# Patient Record
Sex: Female | Born: 1995 | Race: White | Hispanic: Yes | Marital: Single | Smoking: Former smoker
Health system: Southern US, Community
[De-identification: ages and names within clinical notes are randomized; demographics above are authoritative.]

## PROBLEM LIST (undated history)

## (undated) DIAGNOSIS — K219 Gastro-esophageal reflux disease without esophagitis: Secondary | ICD-10-CM

## (undated) DIAGNOSIS — R519 Headache, unspecified: Secondary | ICD-10-CM

## (undated) DIAGNOSIS — Z789 Other specified health status: Secondary | ICD-10-CM

## (undated) HISTORY — DX: Headache, unspecified: R51.9

## (undated) HISTORY — DX: Gastro-esophageal reflux disease without esophagitis: K21.9

## (undated) HISTORY — DX: Other specified health status: Z78.9

## (undated) HISTORY — PX: OTHER SURGICAL HISTORY: SHX169

---
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
## 2019-04-27 ENCOUNTER — Ambulatory Visit: Payer: Self-pay | Admitting: Family Medicine

## 2020-09-10 ENCOUNTER — Other Ambulatory Visit: Payer: Self-pay

## 2020-09-10 ENCOUNTER — Ambulatory Visit (LOCAL_COMMUNITY_HEALTH_CENTER): Payer: Self-pay

## 2020-09-10 VITALS — BP 101/58 | Ht 62.21 in | Wt 197.0 lb

## 2020-09-10 DIAGNOSIS — Z3201 Encounter for pregnancy test, result positive: Secondary | ICD-10-CM

## 2020-09-10 LAB — PREGNANCY, URINE: Preg Test, Ur: POSITIVE — AB

## 2020-09-10 MED ORDER — PRENATAL 27-0.8 MG PO TABS: 1.0000 | ORAL_TABLET | Freq: Every day | ORAL | 0 refills | Status: AC

## 2020-09-10 NOTE — Progress Notes (Signed)
UPT positive. Plans prenatal care at [facility]. No [facility] on file.  Lang line interpreter.  To clerk for preadmit. Jerel Shepherd, RN

## 2020-09-25 ENCOUNTER — Other Ambulatory Visit: Payer: Self-pay

## 2020-09-25 ENCOUNTER — Emergency Department: Payer: Self-pay

## 2020-09-25 ENCOUNTER — Emergency Department
Admission: EM | Admit: 2020-09-25 | Discharge: 2020-09-25 | Disposition: A | Discharge status: Home / Self Care | Payer: Self-pay | Attending: Emergency Medicine | Admitting: Emergency Medicine

## 2020-09-25 DIAGNOSIS — Z87891 Personal history of nicotine dependence: Secondary | ICD-10-CM | POA: Insufficient documentation

## 2020-09-25 DIAGNOSIS — Z3A01 Less than 8 weeks gestation of pregnancy: Secondary | ICD-10-CM | POA: Insufficient documentation

## 2020-09-25 DIAGNOSIS — R102 Pelvic and perineal pain: Secondary | ICD-10-CM

## 2020-09-25 DIAGNOSIS — O219 Vomiting of pregnancy, unspecified: Secondary | ICD-10-CM | POA: Insufficient documentation

## 2020-09-25 DIAGNOSIS — M545 Low back pain, unspecified: Secondary | ICD-10-CM | POA: Insufficient documentation

## 2020-09-25 DIAGNOSIS — R1032 Left lower quadrant pain: Secondary | ICD-10-CM | POA: Insufficient documentation

## 2020-09-25 DIAGNOSIS — O26891 Other specified pregnancy related conditions, first trimester: Secondary | ICD-10-CM | POA: Insufficient documentation

## 2020-09-25 LAB — URINALYSIS, COMPLETE (UACMP) WITH MICROSCOPIC
Bacteria, UA: NONE SEEN
Bilirubin Urine: NEGATIVE
Glucose, UA: NEGATIVE mg/dL
Hgb urine dipstick: NEGATIVE
Ketones, ur: 80 mg/dL — AB
Leukocytes,Ua: NEGATIVE
Nitrite: NEGATIVE
Protein, ur: 30 mg/dL — AB
Specific Gravity, Urine: 1.027 (ref 1.005–1.030)
pH: 6 (ref 5.0–8.0)

## 2020-09-25 LAB — CBC
HCT: 39.5 % (ref 36.0–46.0)
Hemoglobin: 13.3 g/dL (ref 12.0–15.0)
MCH: 28.4 pg (ref 26.0–34.0)
MCHC: 33.7 g/dL (ref 30.0–36.0)
MCV: 84.4 fL (ref 80.0–100.0)
Platelets: 356 10*3/uL (ref 150–400)
RBC: 4.68 MIL/uL (ref 3.87–5.11)
RDW: 13.4 % (ref 11.5–15.5)
WBC: 12.2 10*3/uL — ABNORMAL HIGH (ref 4.0–10.5)
nRBC: 0 % (ref 0.0–0.2)

## 2020-09-25 LAB — LIPASE, BLOOD: Lipase: 29 U/L (ref 11–51)

## 2020-09-25 LAB — COMPREHENSIVE METABOLIC PANEL
ALT: 33 U/L (ref 0–44)
AST: 21 U/L (ref 15–41)
Albumin: 4 g/dL (ref 3.5–5.0)
Alkaline Phosphatase: 65 U/L (ref 38–126)
Anion gap: 8 (ref 5–15)
BUN: 6 mg/dL (ref 6–20)
CO2: 24 mmol/L (ref 22–32)
Calcium: 9.1 mg/dL (ref 8.9–10.3)
Chloride: 101 mmol/L (ref 98–111)
Creatinine, Ser: 0.52 mg/dL (ref 0.44–1.00)
GFR, Estimated: >60 mL/min (ref >60)
Glucose, Bld: 96 mg/dL (ref 70–99)
Potassium: 3.4 mmol/L — ABNORMAL LOW (ref 3.5–5.1)
Sodium: 133 mmol/L — ABNORMAL LOW (ref 135–145)
Total Bilirubin: 0.7 mg/dL (ref 0.3–1.2)
Total Protein: 8.2 g/dL — ABNORMAL HIGH (ref 6.5–8.1)

## 2020-09-25 LAB — HCG, QUANTITATIVE, PREGNANCY: hCG, Beta Chain, Quant, S: 87762 m[IU]/mL — ABNORMAL HIGH (ref <5)

## 2020-09-25 MED ORDER — ONDANSETRON 4 MG PO TBDP: 4.0000 mg | ORAL_TABLET | Freq: Three times a day (TID) | ORAL | 0 refills | Status: DC | PRN

## 2020-09-25 MED ORDER — SODIUM CHLORIDE 0.9 % IV BOLUS
1000.0000 mL | Freq: Once | INTRAVENOUS | Status: AC
  Administered 2020-09-25: 1000 mL via INTRAVENOUS

## 2020-09-25 MED ORDER — ACETAMINOPHEN 500 MG PO TABS
1000.0000 mg | ORAL_TABLET | Freq: Once | ORAL | Status: AC
  Administered 2020-09-25: 1000 mg via ORAL
  Filled 2020-09-25: qty 2

## 2020-09-25 MED ORDER — ONDANSETRON HCL 4 MG/2ML IJ SOLN
4.0000 mg | Freq: Once | INTRAMUSCULAR | Status: AC
  Administered 2020-09-25: 4 mg via INTRAVENOUS
  Filled 2020-09-25: qty 2

## 2020-09-25 MED ORDER — POTASSIUM CHLORIDE ER 10 MEQ PO TBCR: 10.0000 meq | EXTENDED_RELEASE_TABLET | Freq: Every day | ORAL | 0 refills | Status: DC

## 2020-09-25 NOTE — ED Triage Notes (Signed)
Pt states she has had a headache, nausea and vomiting for a week. Pt states she is 2 months pregnant.  Pt states she has not seen an OB yet but her first appointment is 10/04/20  AMN interpreter: Marquita Palms [id number]

## 2020-09-25 NOTE — ED Provider Notes (Signed)
[facility] Emergency Department Provider Note  ____________________________________________   Event Date/Time   First MD Initiated Contact with Patient 09/25/20 2019     (approximate)  I have reviewed the triage vital signs and the nursing notes.   HISTORY  Chief Complaint Emesis During Pregnancy    HPI Katie Olson is a 25 y.o. female Who is currently pregnant who comes in with nausea vomiting.  Patient states that she is about [redacted] weeks pregnant.  She is not had a ultrasound yet this pregnancy.  She states that for the past week she is had nausea and vomiting multiple times a day, nonbloody.  She states that she is had some associated lower back pain and left lower quadrant pain.  She is not taken anything for it, nothing makes it worse except for the vomiting.  Denies having this previously.  This is her third pregnancy.  No prior issues with her pregnancies.  Denies vaginal bleeding.  Denies vaginal discharge or concerns for STDs  This patient partner was used for any history and updates       Past Medical History:  Diagnosis Date  . Patient denies medical problems     Patient Active Problem List   Diagnosis Date Noted  . Breakthrough bleeding on Nexplanon 09/07/2015  . Language barrier 10/10/2014    Past Surgical History:  Procedure Laterality Date  . denies      Prior to Admission medications   Medication Sig Start Date End Date Taking? Authorizing Provider  etonogestrel (NEXPLANON) 68 MG IMPL implant 1 each by Subdermal route once. Patient not taking: Reported on 09/10/2020    [provider]  medroxyPROGESTERone (PROVERA) 10 MG tablet Take 2 tablets (20 mg total) by mouth daily. Patient not taking: Reported on 09/10/2020 09/07/15   Adam Phenix, MD  Prenatal Vit-Fe Fumarate-FA (MULTIVITAMIN-PRENATAL) 27-0.8 MG TABS tablet Take 1 tablet by mouth daily at 12 noon. 09/10/20 12/19/20  Federico Flake, MD     Allergies Patient has no known allergies.  No family history on file.  Social History Social History   Tobacco Use  . Smoking status: Former Smoker    Types: Cigarettes    Quit date: 08/10/2020    Years since quitting: 0.1  . Smokeless tobacco: Never Used  Vaping Use  . Vaping Use: Never used  Substance Use Topics  . Alcohol use: No  . Drug use: No      Review of Systems Constitutional: No fever/chills Eyes: No visual changes. ENT: No sore throat. Cardiovascular: Denies chest pain. Respiratory: Denies shortness of breath. Gastrointestinal: Positive abdominal pain, nausea, vomiting Genitourinary: Negative for dysuria. Musculoskeletal: Negative for back pain. Skin: Negative for rash. Neurological: Negative for headaches, focal weakness or numbness. All other ROS negative ____________________________________________   PHYSICAL EXAM:  VITAL SIGNS: ED Triage Vitals  Enc Vitals Group     BP 09/25/20 1853 131/76     Pulse Rate 09/25/20 1854 87     Resp 09/25/20 1853 17     Temp 09/25/20 1853 98.1 F (36.7 C)     Temp src --      SpO2 09/25/20 1853 98 %     Weight 09/25/20 1855 197 lb (89.4 kg)     Height 09/25/20 1855 5' 10.08" (1.78 m)     Head Circumference --      Peak Flow --      Pain Score 09/25/20 1855 8     Pain Loc --  Pain Edu? --      Excl. in GC? --     Constitutional: Alert and oriented. Well appearing and in no acute distress. Eyes: Conjunctivae are normal. EOMI. Head: Atraumatic. Nose: No congestion/rhinnorhea. Mouth/Throat: Mucous membranes are moist.   Neck: No stridor. Trachea Midline. FROM Cardiovascular: Normal rate, regular rhythm. Grossly normal heart sounds.  Good peripheral circulation. Respiratory: Normal respiratory effort.  No retractions. Lungs CTAB. Gastrointestinal: Soft but slight left lower quadrant tenderness no distention. No abdominal bruits.  Musculoskeletal: No lower extremity tenderness nor edema.  No joint  effusions. Neurologic:  Normal speech and language. No gross focal neurologic deficits are appreciated.  Skin:  Skin is warm, dry and intact. No rash noted. Psychiatric: Mood and affect are normal. Speech and behavior are normal. GU: Deferred   ____________________________________________   LABS (all labs ordered are listed, but only abnormal results are displayed)  Labs Reviewed  COMPREHENSIVE METABOLIC PANEL - Abnormal; Notable for the following components:      Result Value   Sodium 133 (*)    Potassium 3.4 (*)    Total Protein 8.2 (*)    All other components within normal limits  CBC - Abnormal; Notable for the following components:   WBC 12.2 (*)    All other components within normal limits  URINALYSIS, COMPLETE (UACMP) WITH MICROSCOPIC - Abnormal; Notable for the following components:   Color, Urine AMBER (*)    APPearance HAZY (*)    Ketones, ur 80 (*)    Protein, ur 30 (*)    All other components within normal limits  HCG, QUANTITATIVE, PREGNANCY - Abnormal; Notable for the following components:   hCG, Beta Chain, Quant, S 09,983 (*)    All other components within normal limits  URINE CULTURE  LIPASE, BLOOD   ____________________________________________  RADIOLOGY   Official radiology report(s): US OB LESS THAN 14 WEEKS WITH OB TRANSVAGINAL  Result Date: 09/25/2020 CLINICAL DATA:  Left lower quadrant pain, pregnant EXAM: OBSTETRIC <14 WK [place] AND TRANSVAGINAL OB US TECHNIQUE: Both transabdominal and transvaginal ultrasound examinations were performed for complete evaluation of the gestation as well as the maternal uterus, adnexal regions, and pelvic cul-de-sac. Transvaginal technique was performed to assess early pregnancy. COMPARISON:  None. FINDINGS: Intrauterine gestational sac: Single Yolk sac:  Not Visualized. Embryo:  Visualized. Cardiac Activity: Visualized. Heart Rate: 158 bpm CRL:  13.8 mm   7 w   5 d                  [place] EDC: 05/09/2021 Subchorionic hemorrhage:   Small Maternal uterus/adnexae: Normal appearing ovaries. No free fluid seen within the cul-de-sac. IMPRESSION: Single live intrauterine pregnancy measuring 7 weeks 5 days. Electronically Signed   By: Jonna Clark M.D.   On: 09/25/2020 21:54    ____________________________________________   PROCEDURES  Procedure(s) performed (including Critical Care):  Procedures   ____________________________________________   INITIAL IMPRESSION / ASSESSMENT AND PLAN / ED COURSE  Katie Olson was evaluated in Emergency Department on 09/25/2020 for the symptoms described in the history of present illness. She was evaluated in the context of the global COVID-19 pandemic, which necessitated consideration that the patient might be at risk for infection with the SARS-CoV-2 virus that causes COVID-19. Institutional protocols and algorithms that pertain to the evaluation of patients at risk for COVID-19 are in a state of rapid change based on information released by regulatory bodies including the CDC and federal and state organizations. These policies and algorithms were followed  during the patient's care in the ED.    Patient is a 25 year old who is currently pregnant but no prior ultrasound who comes in with nausea vomiting, left lower quadrant pain.  Will get labs to evaluate for Electra abnormalities, AKI, urine evaluate for UTI, ultrasound evaluate for ectopic versus ovarian issue.  No right lower quadrant pain to suggest appendicitis.  No right upper quadrant pain to suggest cholecystitis.  We will give a dose of Zofran after discussing the risk of it as well as some IV fluids.  Declines concerns for STDs and declines pelvic  Labs are reassuring.  Potassium slightly low.  We will give some oral supplementation for home.  White count slightly elevated but no signs of infection  Ultrasound confirms intrauterine pregnancy.  UA has 6-10 whites but no bacteria therefore do not think we need to treat  asymptomatic bacteria.  No dysuria Will send for culture just to make sure.  Reevaluated patient after the fluids and Zofran.  She is feeling better.  Patient is tolerating liquids.  Patient has a follow-up appointment with her OB/GYN in 9 days.  Will discharge patient home with some Zofran and we discussed return precautions  Spanish interpreter was used for this encounter       ____________________________________________   FINAL CLINICAL IMPRESSION(S) / ED DIAGNOSES   Final diagnoses:  Nausea and vomiting in pregnancy      MEDICATIONS GIVEN DURING THIS VISIT:  Medications  sodium chloride 0.9 % bolus 1,000 mL (1,000 mLs Intravenous Bolus from Bag 09/25/20 2053)  acetaminophen (TYLENOL) tablet 1,000 mg (1,000 mg Oral Given 09/25/20 2049)  ondansetron (ZOFRAN) injection 4 mg (4 mg Intravenous Given 09/25/20 2053)     ED Discharge Orders         Ordered    potassium chloride (KLOR-CON) 10 MEQ tablet  Daily        09/25/20 2301    ondansetron (ZOFRAN ODT) 4 MG disintegrating tablet  Every 8 hours PRN        09/25/20 2301           Note:  This document was prepared using Dragon voice recognition software and may include unintentional dictation errors.   Concha Se, MD 09/25/20 [phone]

## 2020-09-25 NOTE — Discharge Instructions (Addendum)
Your potassium was slightly low and you can take the potassium pills at home when you are able to tolerate drinking.  The Zofran will help you with your nausea.  Return to the ER if develop worsening vomiting and nausea he cannot keep anything down.  Otherwise follow-up with your OB/GYN

## 2020-09-25 NOTE — ED Notes (Signed)
Patient transported to Ultrasound 

## 2020-09-25 NOTE — ED Notes (Signed)
Spanish interpreter Katie Olson [phone] utilized.

## 2020-09-27 LAB — URINE CULTURE: Culture: 70000 — AB

## 2020-10-03 ENCOUNTER — Telehealth: Payer: Self-pay

## 2020-10-03 NOTE — Telephone Encounter (Signed)
Based on 09/25/20 U/S in Epic, pt was only 7.5 weeks on that day. Calling pt to reschedule initial OB appt.  Phone call to pt with language line interpreter. Left message on voicemail that based on 09/25/20 U/S, it appears her initial OB appt scheduled for 10/04/20 is a little early. Please call [facility] to reschedule intial OB appt, would like to reschedule about 2 weeks from now. Please call [phone].

## 2020-10-04 ENCOUNTER — Other Ambulatory Visit: Payer: Self-pay

## 2020-10-04 ENCOUNTER — Ambulatory Visit: Payer: Medicaid Other | Admitting: Physician Assistant

## 2020-10-04 VITALS — BP 97/59 | HR 76 | Temp 98.5°F | Ht 62.0 in | Wt 191.4 lb

## 2020-10-04 DIAGNOSIS — E669 Obesity, unspecified: Secondary | ICD-10-CM | POA: Insufficient documentation

## 2020-10-04 DIAGNOSIS — Z3401 Encounter for supervision of normal first pregnancy, first trimester: Secondary | ICD-10-CM

## 2020-10-04 DIAGNOSIS — Z23 Encounter for immunization: Secondary | ICD-10-CM

## 2020-10-04 DIAGNOSIS — O0990 Supervision of high risk pregnancy, unspecified, unspecified trimester: Secondary | ICD-10-CM | POA: Insufficient documentation

## 2020-10-04 DIAGNOSIS — O99210 Obesity complicating pregnancy, unspecified trimester: Secondary | ICD-10-CM | POA: Insufficient documentation

## 2020-10-04 DIAGNOSIS — E66811 Obesity, class 1: Secondary | ICD-10-CM | POA: Insufficient documentation

## 2020-10-04 LAB — URINALYSIS
Bilirubin, UA: NEGATIVE
Glucose, UA: NEGATIVE
Ketones, UA: NEGATIVE
Leukocytes,UA: NEGATIVE
Nitrite, UA: NEGATIVE
Protein,UA: NEGATIVE
RBC, UA: NEGATIVE
Specific Gravity, UA: 1.015 (ref 1.005–1.030)
Urobilinogen, Ur: 0.2 mg/dL (ref 0.2–1.0)
pH, UA: 7 (ref 5.0–7.5)

## 2020-10-04 LAB — HEMOGLOBIN, FINGERSTICK: Hemoglobin: 12.8 g/dL (ref 11.1–15.9)

## 2020-10-04 NOTE — Progress Notes (Signed)
Presents for MH IP. Takes PNV daily. Ed visit on 09/25/20 for nausea/vomiting, see encounter. Urine dip, Hgb reviewed with provider, no tx needed. Flu vaccine given today, tolerated well. COVID booster given today, tolerated well. Sharlyne Pacas, RN

## 2020-10-04 NOTE — Progress Notes (Signed)
[facility] referral for NIPT/cfDNA, snapshot pages and prior [facility] [place] from 09/25/2020 faxed with confirmation received. [facility] scheduler will notify client of appt via phone call. Jossie Ng, RN

## 2020-10-04 NOTE — Telephone Encounter (Signed)
Pt kept 10/04/20 OB appt.

## 2020-10-04 NOTE — Progress Notes (Signed)
[facility] HEALTH DEPT [facility] [address] [place] [address] [phone]  INITIAL PRENATAL VISIT NOTE  Subjective:  Katie Olson is a 25 y.o. G3P2002 at [redacted]w[redacted]d being seen today to start prenatal care at the [facility] Department.  She is currently monitored for the following issues for this high-risk pregnancy and has Language barrier; Breakthrough bleeding on Nexplanon; Obesity (BMI 30.0-34.9); and Obesity in pregnancy on their problem list.  Patient reports no complaints.  Contractions: Not present. Vag. Bleeding: None.  Movement: Absent. Denies leaking of fluid.   Indications for ASA therapy (per uptodate) One of the following: Previous pregnancy with preeclampsia, especially early onset and with an adverse outcome No Multifetal gestation No Chronic hypertension No Type 1 or 2 diabetes mellitus No Chronic kidney disease No Autoimmune disease (antiphospholipid syndrome, systemic lupus erythematosus) No  Two or more of the following: Nulliparity No Obesity (body mass index >30 kg/m2) Yes Family history of preeclampsia in mother or sister No Age ?35 years No Sociodemographic characteristics (African American race, low socioeconomic level) Yes Personal risk factors (eg, previous pregnancy with low birth weight or small for gestational age infant, previous adverse pregnancy outcome [eg, stillbirth], interval >10 years between pregnancies) No   The following portions of the patient's history were reviewed and updated as appropriate: allergies, current medications, past family history, past medical history, past social history, past surgical history and problem list. Problem list updated.  Objective:   Vitals:   10/04/20 0902 10/04/20 0904  BP: (!) 97/59   Pulse: 76   Temp: 98.5 F (36.9 C)   Weight: 191 lb 6.4 oz (86.8 kg)   Height:  5\' 2"  (1.575 m)    Fetal Status: Fetal Heart Rate (bpm): not heard  Fundal Height: 12 cm Movement: Absent  Presentation: Undeterminable   Physical Exam Vitals and nursing note reviewed.  Constitutional:      General: She is not in acute distress.    Appearance: Normal appearance. She is well-developed. She is obese.  HENT:     Head: Normocephalic and atraumatic.     Right Ear: External ear normal.     Left Ear: External ear normal.     Nose: Nose normal. No congestion or rhinorrhea.     Mouth/Throat:     Lips: Pink.     Mouth: Mucous membranes are moist.     Dentition: Normal dentition (normal). No dental caries.     Pharynx: Oropharynx is clear. Uvula midline.     Comments: Dentition: teeth in good repair Eyes:     General: No scleral icterus.    Conjunctiva/sclera: Conjunctivae normal.  Neck:     Thyroid: No thyroid mass or thyromegaly.  Cardiovascular:     Rate and Rhythm: Normal rate.     Pulses: Normal pulses.     Comments: Extremities are warm and well perfused Pulmonary:     Effort: Pulmonary effort is normal.     Breath sounds: Normal breath sounds.  Chest:     Chest wall: No mass.  Breasts:     Tanner Score is 5. Breasts are symmetrical.     Right: Normal. No mass, nipple discharge, skin change or axillary adenopathy.     Left: Normal. No mass, nipple discharge, skin change or axillary adenopathy.    Abdominal:     General: Abdomen is flat.     Palpations: Abdomen is soft.     Tenderness: There is no abdominal tenderness.  Comments: Gravid   Genitourinary:    General: Normal vulva.     Exam position: Lithotomy position.     Pubic Area: No rash.      Labia:        Right: No rash.        Left: No rash.      Vagina: Normal. No vaginal discharge.     Cervix: No cervical motion tenderness or friability.     Uterus: Normal. Enlarged (Gravid 10 week size). Not tender.      Adnexa: Right adnexa normal and left adnexa normal.     Rectum: Normal. No external hemorrhoid.  Musculoskeletal:     Right lower leg: No edema.      Left lower leg: No edema.  Lymphadenopathy:     Cervical: No cervical adenopathy.     Upper Body:     Right upper body: No axillary adenopathy.     Left upper body: No axillary adenopathy.  Skin:    General: Skin is warm.     Capillary Refill: Capillary refill takes less than 2 seconds.     Comments: Tattoos on upper chest  Neurological:     Mental Status: She is alert.     Assessment and Plan:  Pregnancy: G3P2002 at [redacted]w[redacted]d  1. Encounter for supervision of normal first pregnancy in first trimester Desires flu shot and COVID booster today. Sched first trim aneuploidy screening. - Urine Culture - HIV-1/HIV-2 Qualitative RNA - Chlamydia/GC NAA, Confirmation - Glucose, 1 hour gestational - HCV Ab w Reflex to Quant PCR - Hemoglobin, fingerstick - Protein / creatinine ratio, urine - QuantiFERON-TB Gold Plus - Comprehensive metabolic panel - Urinalysis (Urine Dip) - Prenatal profile without Varicella or Rubella - Varicella zoster antibody, IgG - Pap IG (Image Guided)  2. Obesity in pregnancy Baseline labs today. Counseled re: appropriate wt gain. Refer for MNT. Rec low dose aspirin starting at 12 wks - pt agrees with this plan.    Discussed overview of care and coordination with inpatient delivery practices including [facility], [facility], [facility] and [facility].    Preterm labor symptoms and general obstetric precautions including but not limited to vaginal bleeding, contractions, leaking of fluid and fetal movement were reviewed in detail with the patient.  Please refer to After Visit Summary for other counseling recommendations.   Return in about 4 weeks (around 11/01/2020) for Routine prenatal care.  No future appointments.  Landry Dyke, PA-C

## 2020-10-05 LAB — HIV-1/HIV-2 QUALITATIVE RNA
HIV-1 RNA, Qualitative: NONREACTIVE
HIV-2 RNA, Qualitative: NONREACTIVE

## 2020-10-06 LAB — URINE CULTURE

## 2020-10-06 LAB — PROTEIN / CREATININE RATIO, URINE
Creatinine, Urine: 56 mg/dL
Protein, Ur: 6.4 mg/dL
Protein/Creat Ratio: 114 mg/g creat (ref 0–200)

## 2020-10-06 LAB — CHLAMYDIA/GC NAA, CONFIRMATION
Chlamydia trachomatis, NAA: NEGATIVE
Neisseria gonorrhoeae, NAA: NEGATIVE

## 2020-10-07 LAB — COMPREHENSIVE METABOLIC PANEL
ALT: 15 IU/L (ref 0–32)
AST: 17 IU/L (ref 0–40)
Albumin/Globulin Ratio: 1.3 (ref 1.2–2.2)
Albumin: 4 g/dL (ref 3.9–5.0)
Alkaline Phosphatase: 66 IU/L (ref 44–121)
BUN/Creatinine Ratio: 8 — ABNORMAL LOW (ref 9–23)
BUN: 4 mg/dL — ABNORMAL LOW (ref 6–20)
Bilirubin Total: 0.2 mg/dL (ref 0.0–1.2)
CO2: 20 mmol/L (ref 20–29)
Calcium: 9.2 mg/dL (ref 8.7–10.2)
Chloride: 101 mmol/L (ref 96–106)
Creatinine, Ser: 0.5 mg/dL — ABNORMAL LOW (ref 0.57–1.00)
Globulin, Total: 3.1 g/dL (ref 1.5–4.5)
Glucose: 114 mg/dL — ABNORMAL HIGH (ref 65–99)
Potassium: 3.4 mmol/L — ABNORMAL LOW (ref 3.5–5.2)
Sodium: 139 mmol/L (ref 134–144)
Total Protein: 7.1 g/dL (ref 6.0–8.5)
eGFR: 135 mL/min/{1.73_m2} (ref >59)

## 2020-10-07 LAB — CBC/D/PLT+RPR+RH+ABO+AB SCR
Antibody Screen: NEGATIVE
Basophils Absolute: 0 10*3/uL (ref 0.0–0.2)
Basos: 0 %
EOS (ABSOLUTE): 0 10*3/uL (ref 0.0–0.4)
Eos: 0 %
Hematocrit: 38.3 % (ref 34.0–46.6)
Hemoglobin: 13.1 g/dL (ref 11.1–15.9)
Hepatitis B Surface Ag: NEGATIVE
Immature Grans (Abs): 0.1 10*3/uL (ref 0.0–0.1)
Immature Granulocytes: 1 %
Lymphocytes Absolute: 1.6 10*3/uL (ref 0.7–3.1)
Lymphs: 18 %
MCH: 29 pg (ref 26.6–33.0)
MCHC: 34.2 g/dL (ref 31.5–35.7)
MCV: 85 fL (ref 79–97)
Monocytes Absolute: 0.3 10*3/uL (ref 0.1–0.9)
Monocytes: 4 %
Neutrophils Absolute: 6.7 10*3/uL (ref 1.4–7.0)
Neutrophils: 77 %
Platelets: 320 10*3/uL (ref 150–450)
RBC: 4.52 x10E6/uL (ref 3.77–5.28)
RDW: 13.1 % (ref 11.7–15.4)
RPR Ser Ql: NONREACTIVE
Rh Factor: POSITIVE
WBC: 8.7 10*3/uL (ref 3.4–10.8)

## 2020-10-07 LAB — QUANTIFERON-TB GOLD PLUS
QuantiFERON Mitogen Value: >10 IU/mL
QuantiFERON Nil Value: 0.05 IU/mL
QuantiFERON TB1 Ag Value: 0.07 IU/mL
QuantiFERON TB2 Ag Value: 0.06 IU/mL
QuantiFERON-TB Gold Plus: NEGATIVE

## 2020-10-07 LAB — HCV AB W REFLEX TO QUANT PCR: HCV Ab: <0.1 s/co ratio (ref 0.0–0.9)

## 2020-10-07 LAB — HCV INTERPRETATION

## 2020-10-07 LAB — VARICELLA ZOSTER ANTIBODY, IGG: Varicella zoster IgG: 376 index (ref >165)

## 2020-10-07 LAB — GLUCOSE, 1 HOUR GESTATIONAL: Gestational Diabetes Screen: 101 mg/dL (ref 65–139)

## 2020-10-09 LAB — PAP IG (IMAGE GUIDED): PAP Smear Comment: 0

## 2020-10-10 ENCOUNTER — Telehealth: Payer: Self-pay

## 2020-10-10 NOTE — Telephone Encounter (Signed)
[facility] has attempted 3 phone calls to client without success to schedule genetic counseling with [place] appt (referral previously faxed with confirmation). Call to client with [facility] [employer]pre[employer] ID # [phone] and left message requesting client call maternity clinic to discuss [place] appt and need to schedule with [facility]. [facility] number to call provided. Jossie Ng, RN

## 2020-10-11 ENCOUNTER — Telehealth: Payer: Self-pay | Admitting: Dietician

## 2020-10-11 NOTE — Telephone Encounter (Signed)
Phone call to [phone] with interpreter Salli Real.  Pt confirms that [phone] is best number to reach her at. Pt counseled about [facility] has been calling her to try to schedule her 1st trimester genetic counseling and U/S, and this has to be done in a certain time frame. Pt states she will call [facility] right away, number given to Genetic Counseling [phone]), and let them know she is returning their phone call and let them know she needs 1st trimester genetic counseling and U/S.

## 2020-10-11 NOTE — Telephone Encounter (Signed)
Phone call to pt with Marlene [place] interpreter (was going to give alternative [facility] number for U/S scheduling). Pt states she already made U/S appt. Asked pt if she told them she needed Genetic Counseling also, "No" she had not.  Pt states she will call same number and ask for both U/S and Genetic Counseling appts back to back on same day.

## 2020-10-11 NOTE — Telephone Encounter (Signed)
Per [employer], client has 10/29/2020 genetic counseling appt via phone call at 2 pm and 11/05/2020 [place] appt at 1 pm [facility]). Jossie Ng, RN'

## 2020-11-02 ENCOUNTER — Other Ambulatory Visit: Payer: Self-pay

## 2020-11-02 ENCOUNTER — Ambulatory Visit: Payer: Medicaid Other | Admitting: Advanced Practice Midwife

## 2020-11-02 VITALS — BP 108/68 | HR 83 | Temp 97.7°F | Wt 182.0 lb

## 2020-11-02 DIAGNOSIS — O99210 Obesity complicating pregnancy, unspecified trimester: Secondary | ICD-10-CM

## 2020-11-02 DIAGNOSIS — O0990 Supervision of high risk pregnancy, unspecified, unspecified trimester: Secondary | ICD-10-CM

## 2020-11-02 LAB — URINALYSIS
Bilirubin, UA: NEGATIVE
Glucose, UA: NEGATIVE
Leukocytes,UA: NEGATIVE
Nitrite, UA: NEGATIVE
Protein,UA: NEGATIVE
RBC, UA: NEGATIVE
Specific Gravity, UA: 1.015 (ref 1.005–1.030)
Urobilinogen, Ur: 0.2 mg/dL (ref 0.2–1.0)
pH, UA: 7 (ref 5.0–7.5)

## 2020-11-02 MED ORDER — PROMETHAZINE HCL 25 MG PO TABS: 25.0000 mg | ORAL_TABLET | Freq: Four times a day (QID) | ORAL | 0 refills | Status: DC | PRN

## 2020-11-02 NOTE — Progress Notes (Signed)
Urinalysis results reviewed by provider. Provider reviewed results with pt and handwritten Rx for Phenergan provided to pt for N/V.

## 2020-11-02 NOTE — Progress Notes (Signed)
Pt denies visits to ER since last appt with [facility].  Taking PNV.  Pt requesting additional zofran rx, is vomitting about 8x per day. Not currently taking ASA.

## 2020-11-02 NOTE — Progress Notes (Signed)
   PRENATAL VISIT NOTE  Subjective:  Katie Olson is a 25 y.o. G3P2002 at [redacted]w[redacted]d being seen today for ongoing prenatal care.  She is currently monitored for the following issues for this low-risk pregnancy and has Language barrier; Obesity in pregnancy  BMI=33.2; and Supervision of high-risk pregnancy, unspecified trimester on their problem list.  Patient reports N&V daily.  Contractions: Not present. Vag. Bleeding: None.  Movement: Absent. Denies leaking of fluid/ROM.   The following portions of the patient's history were reviewed and updated as appropriate: allergies, current medications, past family history, past medical history, past social history, past surgical history and problem list. Problem list updated.  Objective:   Vitals:   11/02/20 0929  BP: 108/68  Pulse: 83  Temp: 97.7 F (36.5 C)  Weight: 182 lb (82.6 kg)    Fetal Status: Fetal Heart Rate (bpm): 160 Fundal Height: 13 cm Movement: Absent     General:  Alert, oriented and cooperative. Patient is in no acute distress.  Skin: Skin is warm and dry. No rash noted.   Cardiovascular: Normal heart rate noted  Respiratory: Normal respiratory effort, no problems with respiration noted  Abdomen: Soft, gravid, appropriate for gestational age.  Pain/Pressure: Absent     Pelvic: Cervical exam deferred        Extremities: Normal range of motion.  Edema: None  Mental Status: Normal mood and affect. Normal behavior. Normal judgment and thought content.   Assessment and Plan:  Pregnancy: G3P2002 at [redacted]w[redacted]d  1. Obesity in pregnancy Pt not taking ASA 81 mg--counseled and info given. Pt agrees to buy and begin today 2 lb (0.907 kg)  - Urinalysis (Urine Dip)  2. Supervision of high-risk pregnancy, unspecified trimester C/o daily N&V 2+x/day.  No breakfast yet today. 9 lb wt loss in 4 wks. Suggestions given for high protein snacks q 2 hours. Phenergan 25 mg po prn N&V. U/a today Low K at last apt so repeat today. Living with  FOB, 2 daughters, mother in law Not working Reminded of GC, FIRST screen and u/s on 11/05/20  - Comprehensive metabolic panel   Preterm labor symptoms and general obstetric precautions including but not limited to vaginal bleeding, contractions, leaking of fluid and fetal movement were reviewed in detail with the patient. Please refer to After Visit Summary for other counseling recommendations.  No follow-ups on file.  No future appointments.  Alberteen Spindle, CNM

## 2020-11-03 LAB — COMPREHENSIVE METABOLIC PANEL
ALT: 7 IU/L (ref 0–32)
AST: 10 IU/L (ref 0–40)
Albumin/Globulin Ratio: 1.7 (ref 1.2–2.2)
Albumin: 4.3 g/dL (ref 3.9–5.0)
Alkaline Phosphatase: 56 IU/L (ref 44–121)
BUN/Creatinine Ratio: 9 (ref 9–23)
BUN: 4 mg/dL — ABNORMAL LOW (ref 6–20)
Bilirubin Total: 0.2 mg/dL (ref 0.0–1.2)
CO2: 20 mmol/L (ref 20–29)
Calcium: 9.3 mg/dL (ref 8.7–10.2)
Chloride: 101 mmol/L (ref 96–106)
Creatinine, Ser: 0.45 mg/dL — ABNORMAL LOW (ref 0.57–1.00)
Globulin, Total: 2.6 g/dL (ref 1.5–4.5)
Glucose: 79 mg/dL (ref 65–99)
Potassium: 3.6 mmol/L (ref 3.5–5.2)
Sodium: 137 mmol/L (ref 134–144)
Total Protein: 6.9 g/dL (ref 6.0–8.5)
eGFR: 139 mL/min/{1.73_m2} (ref >59)

## 2020-11-16 ENCOUNTER — Other Ambulatory Visit: Payer: Self-pay

## 2020-11-16 ENCOUNTER — Ambulatory Visit: Payer: Medicaid Other | Admitting: Advanced Practice Midwife

## 2020-11-16 VITALS — BP 106/61 | HR 87 | Temp 98.8°F | Wt 183.4 lb

## 2020-11-16 DIAGNOSIS — O0992 Supervision of high risk pregnancy, unspecified, second trimester: Secondary | ICD-10-CM

## 2020-11-16 DIAGNOSIS — O99212 Obesity complicating pregnancy, second trimester: Secondary | ICD-10-CM

## 2020-11-16 DIAGNOSIS — O99210 Obesity complicating pregnancy, unspecified trimester: Secondary | ICD-10-CM

## 2020-11-16 DIAGNOSIS — O0990 Supervision of high risk pregnancy, unspecified, unspecified trimester: Secondary | ICD-10-CM

## 2020-11-16 LAB — URINALYSIS
Bilirubin, UA: NEGATIVE
Glucose, UA: NEGATIVE
Ketones, UA: NEGATIVE
Leukocytes,UA: NEGATIVE
Nitrite, UA: NEGATIVE
Protein,UA: NEGATIVE
RBC, UA: NEGATIVE
Specific Gravity, UA: 1.02 (ref 1.005–1.030)
Urobilinogen, Ur: 1 mg/dL (ref 0.2–1.0)
pH, UA: 6.5 (ref 5.0–7.5)

## 2020-11-16 NOTE — Progress Notes (Addendum)
Aware of [facility] [place] 12/26/2020 at [facility]). Verified had [facility] contact card. Desires AFP only today. Jossie Ng, RN

## 2020-11-16 NOTE — Progress Notes (Signed)
   PRENATAL VISIT NOTE  Subjective:  Katie Olson is a 25 y.o. G3P2002 at [redacted]w[redacted]d being seen today for ongoing prenatal care.  She is currently monitored for the following issues for this high-risk pregnancy and has Language barrier; Obesity in pregnancy  BMI=33.2; and Supervision of high-risk pregnancy, unspecified trimester on their problem list.  Patient reports no complaints.  Contractions: Not present. Vag. Bleeding: None.  Movement: Absent. Denies leaking of fluid/ROM.   The following portions of the patient's history were reviewed and updated as appropriate: allergies, current medications, past family history, past medical history, past social history, past surgical history and problem list. Problem list updated.  Objective:   Vitals:   11/16/20 0849  BP: 106/61  Pulse: 87  Temp: 98.8 F (37.1 C)  Weight: 183 lb 6.4 oz (83.2 kg)    Fetal Status: Fetal Heart Rate (bpm): 150 Fundal Height: 16 cm Movement: Absent     General:  Alert, oriented and cooperative. Patient is in no acute distress.  Skin: Skin is warm and dry. No rash noted.   Cardiovascular: Normal heart rate noted  Respiratory: Normal respiratory effort, no problems with respiration noted  Abdomen: Soft, gravid, appropriate for gestational age.  Pain/Pressure: Absent     Pelvic: Cervical exam deferred        Extremities: Normal range of motion.  Edema: None  Mental Status: Normal mood and affect. Normal behavior. Normal judgment and thought content.   Assessment and Plan:  Pregnancy: G3P2002 at [redacted]w[redacted]d  1. High-risk pregnancy in second trimester States N&V much better; last vomited this am. Hasn't eaten anything yet today (9:10 am). Encouraged small high protein snacks q 2 hours, increase fluids Reviewed 11/05/20 u/s at 13 5/7 with posterior placenta. Cell free DNA screen on 11/05/20=neg Pt reminded of anatomy u/s 12/26/20 Not working AFP only today - AFP Only [facility]- Scanned result - Urinalysis (Urine Dip)  2.  Supervision of high-risk pregnancy, unspecified trimester Low potassium with repeat 11/02/20=wnl 3. Obesity in pregnancy  BMI=33.2 Pt decided to begin taking ASA 81 mg after 11/02/20 apt 3 lb 6.4 oz (1.542 kg) Not exercising--encouraged to 4x/wk   Preterm labor symptoms and general obstetric precautions including but not limited to vaginal bleeding, contractions, leaking of fluid and fetal movement were reviewed in detail with the patient. Please refer to After Visit Summary for other counseling recommendations.  No follow-ups on file.  No future appointments.  Alberteen Spindle, CNM

## 2020-12-10 ENCOUNTER — Encounter: Payer: Self-pay | Admitting: Family Medicine

## 2020-12-10 NOTE — Progress Notes (Signed)
Pap was NIL. Next in 3 years. Health maintenance reflects screening interval.  Give pap card in [facility].

## 2020-12-13 ENCOUNTER — Telehealth: Payer: Self-pay

## 2020-12-13 NOTE — Telephone Encounter (Signed)
Per e-mail from Geralyn Flash RN, Nursing Supervisor, call to client to reschedule [facility] appt 12/14/2020 due to provider availability. Appt rescheduled for 12/21/2020 per client request as has transportation on Fridays. Appt scheduled as requested. Salli Real interpreted during call. Jossie Ng, RN

## 2020-12-14 ENCOUNTER — Ambulatory Visit: Payer: Medicaid Other

## 2020-12-14 ENCOUNTER — Encounter: Payer: Self-pay | Admitting: Nurse Practitioner

## 2020-12-14 NOTE — Progress Notes (Signed)
PAP normal, HPV not done.  Repeat PAP in 3 years (09/2023) per Lyndel Safe, MD. PAP card mailed today.  MyCHART account not active. Glenna Fellows, RN

## 2020-12-19 NOTE — Addendum Note (Signed)
Addended by: Heywood Bene on: 12/19/2020 03:25 PM   Modules accepted: Orders

## 2020-12-21 ENCOUNTER — Other Ambulatory Visit: Payer: Self-pay

## 2020-12-21 ENCOUNTER — Ambulatory Visit: Payer: Medicaid Other | Admitting: Physician Assistant

## 2020-12-21 ENCOUNTER — Encounter: Payer: Self-pay | Admitting: Physician Assistant

## 2020-12-21 VITALS — BP 102/66 | HR 93 | Temp 98.5°F | Wt 184.6 lb

## 2020-12-21 DIAGNOSIS — O99210 Obesity complicating pregnancy, unspecified trimester: Secondary | ICD-10-CM

## 2020-12-21 DIAGNOSIS — O0990 Supervision of high risk pregnancy, unspecified, unspecified trimester: Secondary | ICD-10-CM

## 2020-12-21 NOTE — Progress Notes (Signed)
   PRENATAL VISIT NOTE  Subjective:  Katie Olson is a 25 y.o. G3P2002 at [redacted]w[redacted]d being seen today for ongoing prenatal care.  She is currently monitored for the following issues for this high-risk pregnancy and has Language barrier; Obesity in pregnancy  BMI=33.2; and Supervision of high-risk pregnancy, unspecified trimester on their problem list.  Patient reports trouble sleeping due to hip/pelvic discomfort.  Contractions: Not present. Vag. Bleeding: None.  Movement: Present. Denies leaking of fluid/ROM.   The following portions of the patient's history were reviewed and updated as appropriate: allergies, current medications, past family history, past medical history, past social history, past surgical history and problem list. Problem list updated.  Objective:   Vitals:   12/21/20 1044  BP: 102/66  Pulse: 93  Temp: 98.5 F (36.9 C)  Weight: 184 lb 9.6 oz (83.7 kg)    Fetal Status: Fetal Heart Rate (bpm): 144 Fundal Height: 19 cm Movement: Present     General:  Alert, oriented and cooperative. Patient is in no acute distress.  Skin: Skin is warm and dry. No rash noted.   Cardiovascular: Normal heart rate noted  Respiratory: Normal respiratory effort, no problems with respiration noted  Abdomen: Soft, gravid, appropriate for gestational age.  Pain/Pressure: Present     Pelvic: Cervical exam deferred        Extremities: Normal range of motion.  Edema: None  Mental Status: Normal mood and affect. Normal behavior. Normal judgment and thought content.   Assessment and Plan:  Pregnancy: G3P2002 at [redacted]w[redacted]d  1. Supervision of high-risk pregnancy, unspecified trimester Comfort measures discussed for hip/pelvic discomfort to help with sleep (pillow between knees/under belly when sleeping on side, warm bath/shower before bed). To keep anat [place] as sched on 12/26/20. May also be worried about 6yo daughter (under treatment for Wilm's tumor by [facility]).  2. Obesity in pregnancy   BMI=33.2 Continue daily low dose aspirin.   Preterm labor symptoms and general obstetric precautions including but not limited to vaginal bleeding, contractions, leaking of fluid and fetal movement were reviewed in detail with the patient. Please refer to After Visit Summary for other counseling recommendations.  Return in about 4 weeks (around 01/18/2021) for Routine prenatal care.  Future Appointments  Date Time Provider Department Center  01/18/2021 10:20 AM [facility] PROVIDER [facility] None    Landry Dyke, PA-C

## 2020-12-21 NOTE — Progress Notes (Signed)
Aware of [facility] [place] appt 12/26/2020 at [facility]). Verified has [facility] contact card. Jossie Ng, RN

## 2021-01-18 ENCOUNTER — Ambulatory Visit: Payer: Medicaid Other | Admitting: Family Medicine

## 2021-01-18 ENCOUNTER — Other Ambulatory Visit: Payer: Self-pay

## 2021-01-18 VITALS — BP 94/55 | HR 81 | Temp 98.7°F | Wt 186.4 lb

## 2021-01-18 DIAGNOSIS — O99340 Other mental disorders complicating pregnancy, unspecified trimester: Secondary | ICD-10-CM

## 2021-01-18 DIAGNOSIS — O0990 Supervision of high risk pregnancy, unspecified, unspecified trimester: Secondary | ICD-10-CM

## 2021-01-18 DIAGNOSIS — F419 Anxiety disorder, unspecified: Secondary | ICD-10-CM

## 2021-01-18 DIAGNOSIS — O99210 Obesity complicating pregnancy, unspecified trimester: Secondary | ICD-10-CM

## 2021-01-18 NOTE — Progress Notes (Addendum)
   PRENATAL VISIT NOTE  Subjective:  Katie Olson is a 25 y.o. G3P2002 at [redacted]w[redacted]d being seen today for ongoing prenatal care.  She is currently monitored for the following issues for this high-risk pregnancy and has Language barrier; Obesity in pregnancy  BMI=33.2; and Supervision of high-risk pregnancy, unspecified trimester on their problem list.  Patient reports backache.  Contractions: Not present. Vag. Bleeding: None.  Movement: Present. Denies leaking of fluid/ROM.   The following portions of the patient's history were reviewed and updated as appropriate: allergies, current medications, past family history, past medical history, past social history, past surgical history and problem list. Problem list updated.  Objective:   Vitals:   01/18/21 1037  BP: (!) 94/55  Pulse: 81  Temp: 98.7 F (37.1 C)  Weight: 186 lb 6.4 oz (84.6 kg)    Fetal Status: Fetal Heart Rate (bpm): 141 Fundal Height: 26 cm Movement: Present     General:  Alert, oriented and cooperative. Patient is in no acute distress.  Skin: Skin is warm and dry. No rash noted.   Cardiovascular: Normal heart rate noted  Respiratory: Normal respiratory effort, no problems with respiration noted  Abdomen: Soft, gravid, appropriate for gestational age.  Pain/Pressure: Absent     Pelvic: Cervical exam deferred        Extremities: Normal range of motion.  Edema: None  Mental Status: Normal mood and affect. Normal behavior. Normal judgment and thought content.   Assessment and Plan:  Pregnancy: G3P2002 at [redacted]w[redacted]d  1. Supervision of high-risk pregnancy, unspecified trimester Taking ASA  and PNV  Anticipated guidance - 28 week labs and change to Q 2 weeks visits.   PTL information given today   Pt reports not sleeping at night d/t baby moving and FOB anxiety. -recommend her to take naps during the day to help get some rest.    2. Obesity in pregnancy  BMI=33.2 Discussed walking 30 mins/ day to help with exercise and  strengthen core to help with back pain  3. Anxiety during pregnancy, antepartum  Pt reports that FOB is anxious and causing her to be anxious.  Agreed to talk to LCSW.   - Ambulatory referral to Behavioral Health   Preterm labor symptoms and general obstetric precautions including but not limited to vaginal bleeding, contractions, leaking of fluid and fetal movement were reviewed in detail with the patient. Please refer to After Visit Summary for other counseling recommendations.  Return in about 4 weeks (around 02/15/2021) for 28 week labs, routine prenatal care.  Future Appointments  Date Time Provider Department Center  02/15/2021 10:20 AM [facility] PROVIDER [facility] None   M. [place]  used for [place] interpretation.     Wendi Snipes, FNP

## 2021-01-18 NOTE — Addendum Note (Signed)
Addended by: Wendi Snipes on: 01/18/2021 12:29 PM   Modules accepted: Orders

## 2021-01-18 NOTE — Progress Notes (Signed)
Patient here for MH RV at 24 1/7. S/S PTL today.Burt Knack, RN

## 2021-02-15 ENCOUNTER — Ambulatory Visit: Payer: Medicaid Other

## 2021-02-20 ENCOUNTER — Ambulatory Visit: Payer: Medicaid Other | Admitting: Advanced Practice Midwife

## 2021-02-20 ENCOUNTER — Other Ambulatory Visit: Payer: Self-pay

## 2021-02-20 VITALS — BP 98/57 | HR 96 | Temp 97.8°F | Wt 187.8 lb

## 2021-02-20 DIAGNOSIS — Z23 Encounter for immunization: Secondary | ICD-10-CM

## 2021-02-20 DIAGNOSIS — O99210 Obesity complicating pregnancy, unspecified trimester: Secondary | ICD-10-CM

## 2021-02-20 DIAGNOSIS — O0993 Supervision of high risk pregnancy, unspecified, third trimester: Secondary | ICD-10-CM

## 2021-02-20 DIAGNOSIS — O99213 Obesity complicating pregnancy, third trimester: Secondary | ICD-10-CM

## 2021-02-20 DIAGNOSIS — O0990 Supervision of high risk pregnancy, unspecified, unspecified trimester: Secondary | ICD-10-CM

## 2021-02-20 LAB — HEMOGLOBIN, FINGERSTICK: Hemoglobin: 12 g/dL (ref 11.1–15.9)

## 2021-02-20 NOTE — Progress Notes (Signed)
Counseled regarding recommendation for Tdap in pregnancy and for those who will spend time with infant. Client tolerated Tdap today without complaint. 28 week labs today. Jossie Ng, RN 2 week appt reminder card given. Jossie Ng, RN Hgb = 12.0 and no interventions needed per standing order. Jossie Ng, RN

## 2021-02-20 NOTE — Progress Notes (Signed)
   PRENATAL VISIT NOTE  Subjective:  Katie Olson is a 25 y.o. G3P2002 at [redacted]w[redacted]d being seen today for ongoing prenatal care.  She is currently monitored for the following issues for this high-risk pregnancy and has Language barrier; Obesity in pregnancy  BMI=33.2; and Supervision of high-risk pregnancy, unspecified trimester on their problem list.  Patient reports no complaints.  Contractions: Not present. Vag. Bleeding: None.  Movement: Present. Denies leaking of fluid/ROM.   The following portions of the patient's history were reviewed and updated as appropriate: allergies, current medications, past family history, past medical history, past social history, past surgical history and problem list. Problem list updated.  Objective:   Vitals:   02/20/21 1114  BP: (!) 98/57  Pulse: 96  Temp: 97.8 F (36.6 C)  Weight: 187 lb 12.8 oz (85.2 kg)    Fetal Status: Fetal Heart Rate (bpm): 150 Fundal Height: 29 cm Movement: Present  Presentation: Homero Fellers Breech  General:  Alert, oriented and cooperative. Patient is in no acute distress.  Skin: Skin is warm and dry. No rash noted.   Cardiovascular: Normal heart rate noted  Respiratory: Normal respiratory effort, no problems with respiration noted  Abdomen: Soft, gravid, appropriate for gestational age.  Pain/Pressure: Absent     Pelvic: Cervical exam deferred        Extremities: Normal range of motion.  Edema: None  Mental Status: Normal mood and affect. Normal behavior. Normal judgment and thought content.   Assessment and Plan:  Pregnancy: G3P2002 at [redacted]w[redacted]d  1. High-risk pregnancy in third trimester 28 wk labs today Pt states Kathreen Cosier has not called her yet--given contact # and referral done 42 yo daughter has kidney cancer dx'd 11/2020 and receiving chemo. Will have left kidney removed 02/26/21 at [facility] - Hemoglobin, venipuncture - HIV-1/HIV-2 Qualitative RNA - Syphilis Serology, [facility] - Glucose, 1 hour gestational -  Ambulatory referral to Behavioral Health  2. Supervision of high-risk pregnancy, unspecified trimester Reviewed 12/26/20 u/s at 21 5/7 with 3 VC, EFW=55%, AFI wnl, posterior placenta   3. Obesity in pregnancy  BMI=33.2 7 lb 12.8 oz (3.538 kg) Taking ASA 81 mg daily   Preterm labor symptoms and general obstetric precautions including but not limited to vaginal bleeding, contractions, leaking of fluid and fetal movement were reviewed in detail with the patient. Please refer to After Visit Summary for other counseling recommendations.  No follow-ups on file.  No future appointments.  Alberteen Spindle, CNM

## 2021-02-21 LAB — RPR: RPR Ser Ql: NONREACTIVE

## 2021-02-21 LAB — GLUCOSE, 1 HOUR GESTATIONAL: Gestational Diabetes Screen: 118 mg/dL (ref 65–139)

## 2021-02-22 LAB — HIV-1/HIV-2 QUALITATIVE RNA
HIV-1 RNA, Qualitative: NONREACTIVE
HIV-2 RNA, Qualitative: NONREACTIVE

## 2021-03-06 ENCOUNTER — Ambulatory Visit: Payer: Self-pay

## 2021-03-12 ENCOUNTER — Telehealth: Payer: Self-pay

## 2021-03-12 NOTE — Telephone Encounter (Signed)
TC to patient to reschedule missed MH RV. Patient rescheduled for 03/15/2021. [address] [place], [place] [id number].Marland KitchenBurt Knack, RN

## 2021-03-15 ENCOUNTER — Ambulatory Visit: Payer: Medicaid Other | Admitting: Family Medicine

## 2021-03-15 ENCOUNTER — Other Ambulatory Visit: Payer: Self-pay

## 2021-03-15 VITALS — BP 106/68 | HR 84 | Temp 97.8°F | Wt 190.0 lb

## 2021-03-15 DIAGNOSIS — O0990 Supervision of high risk pregnancy, unspecified, unspecified trimester: Secondary | ICD-10-CM

## 2021-03-15 LAB — URINALYSIS
Bilirubin, UA: NEGATIVE
Glucose, UA: NEGATIVE
Nitrite, UA: NEGATIVE
Protein,UA: NEGATIVE
RBC, UA: NEGATIVE
Specific Gravity, UA: 1.02 (ref 1.005–1.030)
Urobilinogen, Ur: 0.2 mg/dL (ref 0.2–1.0)
pH, UA: 7.5 (ref 5.0–7.5)

## 2021-03-15 NOTE — Progress Notes (Addendum)
Urine dip reviewed by Elveria Rising FNP-BC. Jossie Ng, RN

## 2021-03-16 NOTE — Progress Notes (Signed)
   PRENATAL VISIT NOTE  Subjective:  Katie Olson is a 25 y.o. G3P2002 at [redacted]w[redacted]d being seen today for ongoing prenatal care.  She is currently monitored for the following issues for this high-risk pregnancy and has Language barrier; Obesity in pregnancy  BMI=33.2; and Supervision of high-risk pregnancy, unspecified trimester on their problem list.  Patient reports occasional contractions and feeling baby move down .  Contractions: Irritability. Vag. Bleeding: None.  Movement: Present. Denies leaking of fluid/ROM.   The following portions of the patient's history were reviewed and updated as appropriate: allergies, current medications, past family history, past medical history, past social history, past surgical history and problem list. Problem list updated.  Objective:   Vitals:   03/15/21 1010  BP: 106/68  Pulse: 84  Temp: 97.8 F (36.6 C)  Weight: 190 lb (86.2 kg)    Fetal Status: Fetal Heart Rate (bpm): 135 Fundal Height: 35 cm Movement: Present     General:  Alert, oriented and cooperative. Patient is in no acute distress.  Skin: Skin is warm and dry. No rash noted.   Cardiovascular: Normal heart rate noted  Respiratory: Normal respiratory effort, no problems with respiration noted  Abdomen: Soft, gravid, appropriate for gestational age.  Pain/Pressure: Absent     Pelvic: Cervical exam deferred        Extremities: Normal range of motion.  Edema: None  Mental Status: Normal mood and affect. Normal behavior. Normal judgment and thought content.   Assessment and Plan:  Pregnancy: G3P2002 at [redacted]w[redacted]d  1. Supervision of high-risk pregnancy, unspecified trimester - reviewed 28 week labs.  reports feeling baby move down.  - initial FHT was 165, pt drank water and reassess FHT down to 135- discussed importance staying hydrated.  - U/A- + ketones.  -Appointment with Hazeline Junker on 03/20/21 - reports stressful right now with daughters cancer and sx .    - Urinalysis (Urine  Dip)   Preterm labor symptoms and general obstetric precautions including but not limited to vaginal bleeding, contractions, leaking of fluid and fetal movement were reviewed in detail with the patient. Please refer to After Visit Summary for other counseling recommendations.  Return in about 2 weeks (around 03/29/2021) for routine prenatal care.  Future Appointments  Date Time Provider Department Center  03/20/2021  9:30 AM Kathreen Cosier, LCSW [facility] None  03/29/2021  8:20 AM [facility] PROVIDER [facility] None   M. [place] used for [place] interpretation.     Wendi Snipes, FNP

## 2021-03-20 ENCOUNTER — Ambulatory Visit: Payer: Medicaid Other | Admitting: Licensed Clinical Social Worker

## 2021-03-29 ENCOUNTER — Other Ambulatory Visit: Payer: Self-pay

## 2021-03-29 ENCOUNTER — Ambulatory Visit: Payer: Medicaid Other | Admitting: Advanced Practice Midwife

## 2021-03-29 VITALS — BP 107/54 | HR 83 | Temp 98.3°F | Wt 190.8 lb

## 2021-03-29 DIAGNOSIS — O99213 Obesity complicating pregnancy, third trimester: Secondary | ICD-10-CM

## 2021-03-29 DIAGNOSIS — O0993 Supervision of high risk pregnancy, unspecified, third trimester: Secondary | ICD-10-CM

## 2021-03-29 DIAGNOSIS — O0990 Supervision of high risk pregnancy, unspecified, unspecified trimester: Secondary | ICD-10-CM

## 2021-03-29 DIAGNOSIS — O99210 Obesity complicating pregnancy, unspecified trimester: Secondary | ICD-10-CM

## 2021-03-29 NOTE — Progress Notes (Signed)
Patient here for MH RV at 34 1/7. BH consent signed today.Burt Knack, RN

## 2021-03-29 NOTE — Progress Notes (Signed)
   PRENATAL VISIT NOTE  Subjective:  Katie Olson is a 25 y.o. G3P2002 at [redacted]w[redacted]d being seen today for ongoing prenatal care.  She is currently monitored for the following issues for this high-risk pregnancy and has Language barrier; Obesity in pregnancy  BMI=33.2; and Supervision of high-risk pregnancy, unspecified trimester on their problem list.  Patient reports no complaints.  Contractions: Not present. Vag. Bleeding: None.  Movement: Present. Denies leaking of fluid/ROM.   The following portions of the patient's history were reviewed and updated as appropriate: allergies, current medications, past family history, past medical history, past social history, past surgical history and problem list. Problem list updated.  Objective:   Vitals:   03/29/21 0822  BP: (!) 107/54  Pulse: 83  Temp: 98.3 F (36.8 C)  Weight: 190 lb 12.8 oz (86.5 kg)    Fetal Status: Fetal Heart Rate (bpm): 130 Fundal Height: 36 cm Movement: Present     General:  Alert, oriented and cooperative. Patient is in no acute distress.  Skin: Skin is warm and dry. No rash noted.   Cardiovascular: Normal heart rate noted  Respiratory: Normal respiratory effort, no problems with respiration noted  Abdomen: Soft, gravid, appropriate for gestational age.  Pain/Pressure: Absent     Pelvic: Cervical exam deferred        Extremities: Normal range of motion.  Edema: None  Mental Status: Normal mood and affect. Normal behavior. Normal judgment and thought content.   Assessment and Plan:  Pregnancy: G3P2002 at [redacted]w[redacted]d  1. Obesity in pregnancy  BMI=33.2 10 lb 12.8 oz (4.899 kg) Taking ASA 81 mg daily since 11/02/20 Walking 3x/wk x 30-45 min  2. Supervision of high-risk pregnancy, unspecified trimester 1 hour glucola on 02/20/21=118 Her 69 yo daughter had surgery on 02/26/21 to removed her left kidney and was hospitalized x 1 week and doing better [facility] 03/20/21 apt with Marchelle Folks because pt states she has transportation  issues because her husband works every day but Fridays and that's when she has a car to come here. Informed pt that she needs to reschedule apt with Kathreen Cosier and can have via zoom or phone   Preterm labor symptoms and general obstetric precautions including but not limited to vaginal bleeding, contractions, leaking of fluid and fetal movement were reviewed in detail with the patient. Please refer to After Visit Summary for other counseling recommendations.  Return in about 2 weeks (around 04/12/2021) for routine PNC.  No future appointments.  Alberteen Spindle, CNM

## 2021-04-12 ENCOUNTER — Ambulatory Visit: Payer: Medicaid Other | Admitting: Family Medicine

## 2021-04-12 ENCOUNTER — Other Ambulatory Visit: Payer: Self-pay

## 2021-04-12 VITALS — BP 112/59 | HR 75 | Temp 98.1°F | Wt 193.8 lb

## 2021-04-12 DIAGNOSIS — O0990 Supervision of high risk pregnancy, unspecified, unspecified trimester: Secondary | ICD-10-CM

## 2021-04-12 DIAGNOSIS — O0993 Supervision of high risk pregnancy, unspecified, third trimester: Secondary | ICD-10-CM

## 2021-04-12 NOTE — Progress Notes (Signed)
[facility]  PRENATAL VISIT NOTE  Subjective:  Katie Olson is a 25 y.o. G3P2002 at [redacted]w[redacted]d being seen today for ongoing prenatal care.  She is currently monitored for the following issues for this high-risk pregnancy and has Language barrier; Obesity in pregnancy  BMI=33.2; and Supervision of high-risk pregnancy, unspecified trimester on their problem list.  Patient reports  trouble sleeping d/t baby moving  .  Contractions: Not present. Vag. Bleeding: None.  Movement: Present. Denies leaking of fluid/ROM.   The following portions of the patient's history were reviewed and updated as appropriate: allergies, current medications, past family history, past medical history, past social history, past surgical history and problem list. Problem list updated.  Objective:   Vitals:   04/12/21 0839  BP: (!) 112/59  Pulse: 75  Temp: 98.1 F (36.7 C)  Weight: 193 lb 12.8 oz (87.9 kg)    Fetal Status: Fetal Heart Rate (bpm): 140 Fundal Height: 38 cm Movement: Present     General:  Alert, oriented and cooperative. Patient is in no acute distress.  Skin: Skin is warm and dry. No rash noted.   Cardiovascular: Normal heart rate noted  Respiratory: Normal respiratory effort, no problems with respiration noted  Abdomen: Soft, gravid, appropriate for gestational age.  Pain/Pressure: Absent     Pelvic: Cervical exam deferred        Extremities: Normal range of motion.  Edema: None  Mental Status: Normal mood and affect. Normal behavior. Normal judgment and thought content.   Assessment and Plan:  Pregnancy: G3P2002 at [redacted]w[redacted]d  1. Supervision of high-risk pregnancy, unspecified trimester - 36 week labs today  -discussed postioning and napping during the day to help with missing sleep at night.  -ASA stopped today.  Taking PNV as directed.   -Baby ready-  patient does not have car seat seat, explained importance of getting ready for baby and that  fire department can install/ check car seat.  Is still arranging sleeping for baby.    - Culture, beta strep (group b only) - Chlamydia/GC NAA, Confirmation   Preterm labor symptoms and general obstetric precautions including but not limited to vaginal bleeding, contractions, leaking of fluid and fetal movement were reviewed in detail with the patient. Please refer to After Visit Summary for other counseling recommendations.  Return in about 1 week (around 04/19/2021) for routine prenatal care.  Future Appointments  Date Time Provider Department Center  04/15/2021  3:00 PM Kathreen Cosier, LCSW [facility] None  04/19/2021 11:00 AM [facility] PROVIDER [facility] None   V. [place] used for [place] interpretation.     Wendi Snipes, FNP

## 2021-04-12 NOTE — Progress Notes (Signed)
Patient here for MH RV at 36w 1d.   36 week packet given to patient.  Advised patient to stop taking ASA 81mg .  PHQ9 and Abuse form filled today.   , RN

## 2021-04-15 ENCOUNTER — Ambulatory Visit: Payer: Self-pay | Admitting: Licensed Clinical Social Worker

## 2021-04-15 DIAGNOSIS — B951 Streptococcus, group B, as the cause of diseases classified elsewhere: Secondary | ICD-10-CM | POA: Insufficient documentation

## 2021-04-15 LAB — CULTURE, BETA STREP (GROUP B ONLY): Strep Gp B Culture: POSITIVE — AB
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
## 2021-04-17 LAB — CHLAMYDIA/GC NAA, CONFIRMATION
Chlamydia trachomatis, NAA: NEGATIVE
Neisseria gonorrhoeae, NAA: NEGATIVE

## 2021-04-19 ENCOUNTER — Other Ambulatory Visit: Payer: Self-pay

## 2021-04-19 ENCOUNTER — Ambulatory Visit: Payer: Medicaid Other | Admitting: Family Medicine

## 2021-04-19 VITALS — BP 96/55 | HR 54 | Temp 98.2°F | Wt 196.2 lb

## 2021-04-19 DIAGNOSIS — O0993 Supervision of high risk pregnancy, unspecified, third trimester: Secondary | ICD-10-CM

## 2021-04-19 DIAGNOSIS — O0990 Supervision of high risk pregnancy, unspecified, unspecified trimester: Secondary | ICD-10-CM

## 2021-04-19 NOTE — Progress Notes (Signed)
Patient here at 37w 1d for MH RV. Patient counseled on GBS positive status - GBS positive handout given and questions answered.   Floy Sabina, RN

## 2021-04-23 NOTE — Progress Notes (Signed)
[facility] Department Maternal Health Clinic  PRENATAL VISIT NOTE  Subjective:  Katie Olson is a 25 y.o. G3P2002 at [redacted]w[redacted]d being seen today for ongoing prenatal care.  She is currently monitored for the following issues for this high-risk pregnancy and has Language barrier; Obesity in pregnancy  BMI=33.2; Supervision of high-risk pregnancy, unspecified trimester; and Positive GBS test on their problem list.  Patient reports  light pink discharge for  1 day .  Contractions: Irritability. Vag. Bleeding: None.  Movement: Present. Denies leaking of fluid/ROM.   The following portions of the patient's history were reviewed and updated as appropriate: allergies, current medications, past family history, past medical history, past social history, past surgical history and problem list. Problem list updated.  Objective:   Vitals:   04/19/21 1127  BP: (!) 96/55  Pulse: (!) 54  Temp: 98.2 F (36.8 C)  Weight: 196 lb 3.2 oz (89 kg)    Fetal Status: Fetal Heart Rate (bpm): 145 Fundal Height: 39 cm Movement: Present     General:  Alert, oriented and cooperative. Patient is in no acute distress.  Skin: Skin is warm and dry. No rash noted.   Cardiovascular: Normal heart rate noted  Respiratory: Normal respiratory effort, no problems with respiration noted  Abdomen: Soft, gravid, appropriate for gestational age.  Pain/Pressure: Present     Pelvic: Cervical exam deferred        Extremities: Normal range of motion.  Edema: None  Mental Status: Normal mood and affect. Normal behavior. Normal judgment and thought content.   Assessment and Plan:  Pregnancy: G3P2002 at [redacted]w[redacted]d  1. Supervision of high-risk pregnancy, unspecified trimester - pt seen on 9/19 at ED and kept for overnight observation d/t abdominal cramping and increased pelvic pain, pt was unsure if was having PTL.   Per [facility] note : Her cervical examinations were as follows:  1030 fingertip/30/-5 1130 2/50/-4, cramping  and pressure stronger. Ctx q2-10 min on monitor.  1330 3/50/-4, cramping and pressure stronger. Ctx q2-10 min on monitor.  1745 4/50/-3  -reactive NST. And labs WNL.  Patient discharged to home   Pt denies any s/sx of PTL at tjis time.  Reports having light pink mucus like discharge. Discussed about mucus plug loss and cervical changes as preparing for delivery.  Pt verbalized understanding.   Discussed when to call or go to the hospital.  Pt is baby ready.    Term labor symptoms and general obstetric precautions including but not limited to vaginal bleeding, contractions, leaking of fluid and fetal movement were reviewed in detail with the patient. Please refer to After Visit Summary for other counseling recommendations.  No follow-ups on file.  Future Appointments  Date Time Provider Department Center  04/26/2021  8:20 AM [facility] PROVIDER [facility] None  04/30/2021  3:00 PM Kathreen Cosier, LCSW [facility] None   M. Yemen used for [place] interpretation.     Wendi Snipes, FNP

## 2021-04-26 ENCOUNTER — Other Ambulatory Visit: Payer: Self-pay

## 2021-04-26 ENCOUNTER — Ambulatory Visit: Payer: Medicaid Other | Admitting: Family Medicine

## 2021-04-26 VITALS — BP 104/68 | HR 81 | Temp 98.4°F | Wt 196.6 lb

## 2021-04-26 DIAGNOSIS — O99213 Obesity complicating pregnancy, third trimester: Secondary | ICD-10-CM

## 2021-04-26 DIAGNOSIS — Z23 Encounter for immunization: Secondary | ICD-10-CM

## 2021-04-26 DIAGNOSIS — O0990 Supervision of high risk pregnancy, unspecified, unspecified trimester: Secondary | ICD-10-CM

## 2021-04-26 DIAGNOSIS — O0993 Supervision of high risk pregnancy, unspecified, third trimester: Secondary | ICD-10-CM

## 2021-04-26 DIAGNOSIS — O99210 Obesity complicating pregnancy, unspecified trimester: Secondary | ICD-10-CM

## 2021-04-26 NOTE — Progress Notes (Signed)
[facility] Health Department Maternal Health Clinic  PRENATAL VISIT NOTE  Subjective:  Katie Olson is a 25 y.o. G3P2002 at [redacted]w[redacted]d being seen today for ongoing prenatal care.  She is currently monitored for the following issues for this high-risk pregnancy and has Language barrier; Obesity in pregnancy  BMI=33.2; Supervision of high-risk pregnancy, unspecified trimester; and Positive GBS test on their problem list.  Patient reports no complaints.  Contractions: Irritability. Vag. Bleeding: None.  Movement: Present. Denies leaking of fluid/ROM.   The following portions of the patient's history were reviewed and updated as appropriate: allergies, current medications, past family history, past medical history, past social history, past surgical history and problem list. Problem list updated.  Objective:   Vitals:   04/26/21 0843  BP: 104/68  Pulse: 81  Temp: 98.4 F (36.9 C)  Weight: 196 lb 9.6 oz (89.2 kg)    Fetal Status: Fetal Heart Rate (bpm): 139 Fundal Height: 40 cm Movement: Present     General:  Alert, oriented and cooperative. Patient is in no acute distress.  Skin: Skin is warm and dry. No rash noted.   Cardiovascular: Normal heart rate noted  Respiratory: Normal respiratory effort, no problems with respiration noted  Abdomen: Soft, gravid, appropriate for gestational age.  Pain/Pressure: Present     Pelvic: Cervical exam deferred        Extremities: Normal range of motion.  Edema: Trace  Mental Status: Normal mood and affect. Normal behavior. Normal judgment and thought content.   Assessment and Plan:  Pregnancy: G3P2002 at [redacted]w[redacted]d  1. Supervision of high-risk pregnancy, unspecified trimester - taking PNV  -rescheduled appointment with Hazeline Junker, LCSW. Pt missed last appointment d/t being in the hospital.  -anticipated guidance - 39 wk IOL exam.   2. Obesity in pregnancy  BMI=33.2 - walking 20-30 mins most days    Term labor symptoms and general  obstetric precautions including but not limited to vaginal bleeding, contractions, leaking of fluid and fetal movement were reviewed in detail with the patient. Please refer to After Visit Summary for other counseling recommendations.  Return in about 1 week (around 05/03/2021) for routine prenatal care.  Future Appointments  Date Time Provider Department Center  04/30/2021  3:00 PM Kathreen Cosier, LCSW [facility] None  05/03/2021  9:00 AM [facility] PROVIDER [facility] None   M. Yemen  used for [place] interpretation.     Wendi Snipes, FNP

## 2021-04-26 NOTE — Progress Notes (Signed)
Roddie Mc Yemen, interpreter, to notify client of next appt with Kathreen Cosier LCSW as RN unable to see appt in Epic. Verified client has [facility] contact card. Jossie Ng, RN

## 2021-04-30 ENCOUNTER — Ambulatory Visit: Payer: Medicaid Other | Admitting: Licensed Clinical Social Worker

## 2021-04-30 DIAGNOSIS — Z0389 Encounter for observation for other suspected diseases and conditions ruled out: Secondary | ICD-10-CM

## 2021-04-30 NOTE — Progress Notes (Signed)
Counselor Initial Adult Exam  Name: Katie Olson Date: 04/30/2021 MRN: [id number] DOB: 1995/08/29 PCP: Patient, No Pcp Per (Inactive)  Time spent: 40 minutes   A biopsychosocial was completed on the Patient. Background information and current concerns were obtained during an intake on Zoom with the [facility] Department clinician, Kathreen Cosier, LCSW. Contact information and confidentiality was discussed and appropriate consents were signed.     Reason for Visit /Presenting Problem: Patient presents due to referral related to anxiety that patient reports is no longer an issue. She reports that she was under a lot of stress due to her 25yo having a surgery planned and receiving cancer treatment. Patient shares that her daughter had a successful surgery and the treatment regimen is decreasing because she is getting better. Patient reports a little anxiousness about the upcoming birth of her son, her EDD is May 11, 2021.   Patient reports having a supportive husband of 10/11 months. She shares that she also has a supportive relationship with her in-laws. In addition, she reports having a good relationship with her mom who is in [place]. Patient reports that she has lived in the [place] for 2 years but has lived here in the past, about 6 years ago and at that time she lived here for 2 years. Patient reports not having any friends.    Patient denies any current mental health symptoms and denies any past history of mental health issues.   Mental Status Exam:    Appearance:   NA     Behavior:  Appropriate and Sharing  Motor:  NA  Speech/Language:   Normal Rate  Affect:  NA  Mood:  normal  Thought process:  normal  Thought content:    WNL  Sensory/Perceptual disturbances:    WNL  Orientation:  oriented to person, place, time/date, and situation  Attention:  Good  Concentration:  Good  Memory:  WNL  Fund of knowledge:   Good  Insight:    Good  Judgment:   Good  Impulse  Control:  Good   Reported Symptoms:   patient denies any current mental health symptoms or concerns  Risk Assessment: Danger to Self:  No Self-injurious Behavior: No Danger to Others: No Duty to Warn:no Physical Aggression / Violence:No  Access to Firearms a concern: No  Gang Involvement:No  Patient / guardian was educated about steps to take if suicide or homicide risk level increases between visits: yes While future psychiatric events cannot be accurately predicted, the patient does not currently require acute inpatient psychiatric care and does not currently meet [facility] involuntary commitment criteria.  Substance Abuse History: Current substance abuse: No     Past Psychiatric History:   No previous psychological problems have been observed Outpatient Providers: [facility] is Maternity Provider  History of Psych Hospitalization: No   Abuse History: Victim of No. Report needed: No. Victim of Neglect:No. Perpetrator of  No   Witness / Exposure to Domestic Violence: No   Protective Services Involvement: No  Witness to [employer] Violence:   not asked   Family History:  Family History  Problem Relation Age of Onset   Multiple births Sister        twins, "not identical"   Diabetes Maternal Grandmother    Diabetes Maternal Grandfather    Social History:  Social History   Socioeconomic History   Marital status: Single    Spouse name: FOB= Reyes   Number of children: 2   Years of education: 9  Highest education level: 9th grade  Occupational History   Occupation: Unemployed  Tobacco Use   Smoking status: Former    Types: Cigarettes    Quit date: 08/10/2020    Years since quitting: 0.7   Smokeless tobacco: Never   Tobacco comments:    used to smoke 4 singles /day; Passive Expousre- parttner smokes  Vaping Use   Vaping Use: Never used  Substance and Sexual Activity   Alcohol use: Never   Drug use: Never   Sexual activity: Not Currently    Birth  control/protection: None  Other Topics Concern   Not on file  Social History Narrative   Not on file   Social Determinants of Health   Financial Resource Strain: Low Risk    Difficulty of Paying Living Expenses: Not hard at all  Food Insecurity: No Food Insecurity   Worried About Programme researcher, broadcasting/film/video in the Last Year: Never true   Ran Out of Food in the Last Year: Never true  Transportation Needs: No Transportation Needs   Lack of Transportation (Medical): No   Lack of Transportation (Non-Medical): No  Physical Activity: Not on file  Stress: Not on file  Social Connections: Not on file   Living situation: the patient lives with husband, her children, and her mother in-law  Sexual Orientation:  Straight  Relationship Status: married  Name of spouse / other: NA             If a parent, number of children / ages: 2 children ages 11, 72 and is currently pregnant.   Support Systems; spouse, family  Financial Stress:  Yes  a little but are "hanging in there"  Income/Employment/Disability: supported by her husband   Financial planner: No   Educational History: Education: 9th grade  Religion/Sprituality/World View:    no  Any cultural differences that may affect / interfere with treatment:  not applicable   Recreation/Hobbies: like watching movies  Stressors:Other: None    Strengths:  Supportive Relationships, family and her daughters   Barriers:  Internet issues    Legal History: Pending legal issue / charges: The patient has no significant history of legal issues. History of legal issue / charges:  No  Medical History/Surgical History:reviewed Past Medical History:  Diagnosis Date   Patient denies medical problems    Past Surgical History:  Procedure Laterality Date   denies      Medications: Current Outpatient Medications  Medication Sig Dispense Refill   acetaminophen (TYLENOL) 500 MG tablet Take 1,000 mg by mouth every 6 (six) hours as needed.     aspirin  EC 81 MG tablet Take 81 mg by mouth daily. Swallow whole. (Patient not taking: No sig reported)     Prenatal Vit-Fe Fumarate-FA (MULTIVITAMIN-PRENATAL) 27-0.8 MG TABS tablet Take 1 tablet by mouth daily at 12 noon.     No current facility-administered medications for this visit.   No Known Allergies  Katie Olson is a 25 y.o. year old female with no reported history of mental health diagnosis. Patient currently denies any mental health symptoms and reports she presented due to a referral regarding anxiety which patient reports was due to significant stressors that have now resolved.  Due to the above symptoms and patient's reported history, patient is not diagnosed at this time.   There is no acute risk for suicide or violence at this time.  While future psychiatric events cannot be accurately predicted, the patient does not require acute inpatient psychiatric care and  does not currently meet [facility] involuntary commitment criteria.  Diagnoses:    ICD-10-CM   1. Observation, no conditions found  Z03.89      Plan of Care:  Patient's to follow up as needed.   Interpreter used: Marlene Yemen  Kathreen Cosier, [place]

## 2021-05-03 ENCOUNTER — Ambulatory Visit: Payer: Medicaid Other | Admitting: Family Medicine

## 2021-05-03 ENCOUNTER — Other Ambulatory Visit: Payer: Self-pay

## 2021-05-03 DIAGNOSIS — O0990 Supervision of high risk pregnancy, unspecified, unspecified trimester: Secondary | ICD-10-CM

## 2021-05-03 NOTE — Progress Notes (Signed)
IOL form received today by provider and faxed to [facility] with ok confirmation.   Floy Sabina, RN

## 2021-05-03 NOTE — Progress Notes (Signed)
[facility] Department Maternal Health Clinic  PRENATAL VISIT NOTE  Subjective:  Katie Olson is a 25 y.o. G3P2002 at [redacted]w[redacted]d being seen today for ongoing prenatal care.  She is currently monitored for the following issues for this high-risk pregnancy and has Language barrier; Obesity in pregnancy  BMI=33.2; Supervision of high-risk pregnancy, unspecified trimester; and Positive GBS test on their problem list.  Patient reports occasional contractions.  Contractions: Irritability. Vag. Bleeding: None.  Movement: Present. Denies leaking of fluid/ROM.   The following portions of the patient's history were reviewed and updated as appropriate: allergies, current medications, past family history, past medical history, past social history, past surgical history and problem list. Problem list updated.  Objective:   Vitals:   05/03/21 0914  BP: 114/63  Pulse: 79  Temp: (!) 97 F (36.1 C)  Weight: 197 lb (89.4 kg)    Fetal Status: Fetal Heart Rate (bpm): 138 Fundal Height: 41 cm Movement: Present  Presentation: Vertex  General:  Alert, oriented and cooperative. Patient is in no acute distress.  Skin: Skin is warm and dry. No rash noted.   Cardiovascular: Normal heart rate noted  Respiratory: Normal respiratory effort, no problems with respiration noted  Abdomen: Soft, gravid, appropriate for gestational age.  Pain/Pressure: Present     Pelvic: Cervical exam performed Dilation: 1.5 Effacement (%): 50 Station: -3  Extremities: Normal range of motion.  Edema: Trace  Mental Status: Normal mood and affect. Normal behavior. Normal judgment and thought content.   Assessment and Plan:  Pregnancy: G3P2002 at [redacted]w[redacted]d  1. Supervision of high-risk pregnancy, unspecified trimester -taking PNV as directed  -baby ready  -discussed IOL and home methods to induce labor  -IOL form completed today -walking 20-30 mins daily  -Discussed membrane sweeping today. Reviewed cochrane review data  on membrane sweeping at 39 wks and then at EDD. Reviewed risk of cramping, contractions, bleeding and ROM. Answered patient questions and she declined to proceed with procedure.     Term labor symptoms and general obstetric precautions including but not limited to vaginal bleeding, contractions, leaking of fluid and fetal movement were reviewed in detail with the patient. Please refer to After Visit Summary for other counseling recommendations.  No follow-ups on file.  Future Appointments  Date Time Provider Department Center  05/10/2021  9:00 AM [facility] PROVIDER AC[facility] None   V. Olmedo used for [place] interpretation.     Wendi Snipes, FNP

## 2021-05-06 DIAGNOSIS — O0993 Supervision of high risk pregnancy, unspecified, third trimester: Secondary | ICD-10-CM | POA: Diagnosis not present

## 2021-05-10 ENCOUNTER — Ambulatory Visit: Payer: Medicaid Other

## 2021-09-15 ENCOUNTER — Emergency Department
Admission: EM | Admit: 2021-09-15 | Discharge: 2021-09-15 | Disposition: A | Discharge status: Home / Self Care | Payer: Medicaid Other | Attending: Emergency Medicine | Admitting: Emergency Medicine

## 2021-09-15 ENCOUNTER — Emergency Department: Payer: Medicaid Other

## 2021-09-15 ENCOUNTER — Other Ambulatory Visit: Payer: Self-pay

## 2021-09-15 DIAGNOSIS — R1011 Right upper quadrant pain: Secondary | ICD-10-CM

## 2021-09-15 DIAGNOSIS — K8020 Calculus of gallbladder without cholecystitis without obstruction: Secondary | ICD-10-CM | POA: Diagnosis not present

## 2021-09-15 LAB — URINALYSIS, ROUTINE W REFLEX MICROSCOPIC
Bacteria, UA: NONE SEEN
Bilirubin Urine: NEGATIVE
Glucose, UA: NEGATIVE mg/dL
Ketones, ur: NEGATIVE mg/dL
Nitrite: NEGATIVE
Protein, ur: NEGATIVE mg/dL
Specific Gravity, Urine: 1.018 (ref 1.005–1.030)
pH: 5 (ref 5.0–8.0)

## 2021-09-15 LAB — COMPREHENSIVE METABOLIC PANEL
ALT: 54 U/L — ABNORMAL HIGH (ref 0–44)
AST: 119 U/L — ABNORMAL HIGH (ref 15–41)
Albumin: 3.7 g/dL (ref 3.5–5.0)
Alkaline Phosphatase: 101 U/L (ref 38–126)
Anion gap: 9 (ref 5–15)
BUN: 17 mg/dL (ref 6–20)
CO2: 26 mmol/L (ref 22–32)
Calcium: 9 mg/dL (ref 8.9–10.3)
Chloride: 102 mmol/L (ref 98–111)
Creatinine, Ser: 0.54 mg/dL (ref 0.44–1.00)
GFR, Estimated: >60 mL/min (ref >60)
Glucose, Bld: 106 mg/dL — ABNORMAL HIGH (ref 70–99)
Potassium: 3.8 mmol/L (ref 3.5–5.1)
Sodium: 137 mmol/L (ref 135–145)
Total Bilirubin: 0.4 mg/dL (ref 0.3–1.2)
Total Protein: 7.2 g/dL (ref 6.5–8.1)

## 2021-09-15 LAB — CBC
HCT: 33.9 % — ABNORMAL LOW (ref 36.0–46.0)
Hemoglobin: 10.2 g/dL — ABNORMAL LOW (ref 12.0–15.0)
MCH: 22.2 pg — ABNORMAL LOW (ref 26.0–34.0)
MCHC: 30.1 g/dL (ref 30.0–36.0)
MCV: 73.7 fL — ABNORMAL LOW (ref 80.0–100.0)
Platelets: 397 10*3/uL (ref 150–400)
RBC: 4.6 MIL/uL (ref 3.87–5.11)
RDW: 15.4 % (ref 11.5–15.5)
WBC: 12.8 10*3/uL — ABNORMAL HIGH (ref 4.0–10.5)
nRBC: 0 % (ref 0.0–0.2)

## 2021-09-15 LAB — PREGNANCY, URINE: Preg Test, Ur: NEGATIVE

## 2021-09-15 LAB — LIPASE, BLOOD: Lipase: 35 U/L (ref 11–51)

## 2021-09-15 NOTE — ED Provider Notes (Signed)
[facility] Provider Note    Event Date/Time   First MD Initiated Contact with Patient 09/15/21 [phone]     (approximate)   History   Abdominal Pain   HPI  Katie Olson is a 26 y.o. female  who, per discharge summary from outside hospital dated 05/07/2022 recently had vaginal delivery, presents to the emergency department today because of concern for right upper quadrant abdominal pain. Started overnight. It was accompanied by vomiting. She states she had similar pain rarely during her recent pregnancy. At the time of my exam the patient states that her pain has improved. No fevers.    Physical Exam   Triage Vital Signs: ED Triage Vitals  Enc Vitals Group     BP 09/15/21 0251 111/65     Pulse Rate 09/15/21 0251 98     Resp 09/15/21 0251 19     Temp 09/15/21 0251 98.1 F (36.7 C)     Temp Source 09/15/21 0251 Oral     SpO2 09/15/21 0251 96 %     Weight 09/15/21 0249 190 lb (86.2 kg)     Height 09/15/21 0249 5\' 2"  (1.575 m)     Head Circumference --      Peak Flow --      Pain Score 09/15/21 0249 5   Most recent vital signs: Vitals:   09/15/21 0251 09/15/21 0530  BP: 111/65 114/66  Pulse: 98 81  Resp: 19 17  Temp: 98.1 F (36.7 C)   SpO2: 96% 99%    General: Awake, no distress.  CV:  Good peripheral perfusion.  Resp:  Normal effort.  Abd:  No distention. No tenderness. Negative 09/17/21.   ED Results / Procedures / Treatments   Labs (all labs ordered are listed, but only abnormal results are displayed) Labs Reviewed  COMPREHENSIVE METABOLIC PANEL - Abnormal; Notable for the following components:      Result Value   Glucose, Bld 106 (*)    AST 119 (*)    ALT 54 (*)    All other components within normal limits  CBC - Abnormal; Notable for the following components:   WBC 12.8 (*)    Hemoglobin 10.2 (*)    HCT 33.9 (*)    MCV 73.7 (*)    MCH 22.2 (*)    All other components within normal limits  URINALYSIS, ROUTINE W  REFLEX MICROSCOPIC - Abnormal; Notable for the following components:   Color, Urine YELLOW (*)    APPearance HAZY (*)    Hgb urine dipstick SMALL (*)    Leukocytes,Ua TRACE (*)    All other components within normal limits  LIPASE, BLOOD  PREGNANCY, URINE     EKG  None   RADIOLOGY RUQ [employer] I independently interpreted and visualized the RUQ [place]. My interpretation: Gallstones Radiology interpretation:  IMPRESSION:  1. Cholelithiasis. No sonographic features to suggest acute  cholecystitis.  2. No biliary dilatation.     PROCEDURES:  Critical Care performed: No  Procedures   MEDICATIONS ORDERED IN ED: Medications - No data to display   IMPRESSION / MDM / ASSESSMENT AND PLAN / ED COURSE  I reviewed the triage vital signs and the nursing notes.                              Differential diagnosis includes, but is not limited to, gallbladder disease, hepatitis, pancreatitis, food poisoning.   Patient presented to  the emergency department today because of concerns for right upper quadrant pain.  At the time my exam her pain had improved.  Patient is nontender in the right upper quadrant.  Right upper quadrant ultrasound shows gallstones without any findings consistent with cholecystitis.  I did have a discussion with the patient via interpreter about this finding.  At this time I do think patient suffering from biliary colic.  I have low concern for cholecystitis.  Will have patient follow-up with surgery.  Gave patient return precautions.   FINAL CLINICAL IMPRESSION(S) / ED DIAGNOSES   Final diagnoses:  RUQ abdominal pain  Gallstones     Note:  This document was prepared using Dragon voice recognition software and may include unintentional dictation errors.    Phineas Semen, MD 09/15/21 [phone]

## 2021-09-15 NOTE — ED Notes (Signed)
Pt knows need of urine.

## 2021-09-15 NOTE — ED Triage Notes (Signed)
PT arrived via [facility]. Pt is having right sided abdominal pain , nausea, vomiting,diarrhea, dizziness and shortness of breath starting at 1 PM.

## 2021-09-15 NOTE — Discharge Instructions (Signed)
Please be sure to follow up with surgery. Please return for any return of pain, persistent vomiting, fevers or any other new or concerning symptoms.

## 2021-09-23 ENCOUNTER — Other Ambulatory Visit: Payer: Self-pay

## 2021-09-23 ENCOUNTER — Ambulatory Visit (INDEPENDENT_AMBULATORY_CARE_PROVIDER_SITE_OTHER): Payer: Medicaid Other | Admitting: Surgery

## 2021-09-23 ENCOUNTER — Encounter: Payer: Self-pay | Admitting: Surgery

## 2021-09-23 VITALS — BP 110/69 | HR 84 | Temp 99.0°F | Ht 62.0 in | Wt 204.8 lb

## 2021-09-23 DIAGNOSIS — K8020 Calculus of gallbladder without cholecystitis without obstruction: Secondary | ICD-10-CM

## 2021-09-23 NOTE — Progress Notes (Signed)
09/23/2021  Reason for Visit:  Symptomatic cholelithiasis  History of Present Illness: Katie Olson is a 26 y.o. female presenting for evaluation of symptomatic cholelithiasis.  Patient presents emergency room on 09/15/2021 with an episode of biliary colic which was resolving by the time she was seen by a physician there.  Her episode of pain was in the right upper quadrant and was associated with nausea and vomiting.  In the emergency room, her work-up showed a WBC of 12.8, bilirubin of 0.4, AST 119, ALT 54, alkaline phosphatase 101, lipase 35.  She also had an ultrasound which showed multiple gallstones largest of which measures about 2 cm but without any gallbladder wall thickening or pericholecystic fluid.  As her episode had resolved, she was discharged home.  The patient reports that during her pregnancy she also was having issues with upper abdominal pain that she was attributing to her pregnancy itself, but there is a possibility that this could have been related to her gallbladder.  After her pregnancy, she had 1 minor episode of similar pain which was milder in nature compared to the one that brought her to the emergency room last week.  No past surgical history  Past Medical History: Past Medical History:  Diagnosis Date   Patient denies medical problems      Past Surgical History: Past Surgical History:  Procedure Laterality Date   denies      Home Medications: Prior to Admission medications   Medication Sig Start Date End Date Taking? Authorizing Provider  acetaminophen (TYLENOL) 500 MG tablet Take 1,000 mg by mouth every 6 (six) hours as needed.    [provider]  aspirin EC 81 MG tablet Take 81 mg by mouth daily. Swallow whole. Patient not taking: No sig reported    [provider]  Prenatal Vit-Fe Fumarate-FA (MULTIVITAMIN-PRENATAL) 27-0.8 MG TABS tablet Take 1 tablet by mouth daily at 12 noon.    [provider]    Allergies: No  Known Allergies  Social History:  reports that she quit smoking about 13 months ago. Her smoking use included cigarettes. She has never used smokeless tobacco. She reports that she does not drink alcohol and does not use drugs.   Family History: Family History  Problem Relation Age of Onset   Multiple births Sister        twins, "not identical"   Diabetes Maternal Grandmother    Diabetes Maternal Grandfather     Review of Systems: Review of Systems  Constitutional:  Negative for chills and fever.  HENT:  Negative for hearing loss.   Respiratory:  Negative for shortness of breath.   Cardiovascular:  Negative for chest pain.  Gastrointestinal:  Positive for abdominal pain, nausea and vomiting.  Genitourinary:  Negative for dysuria.  Musculoskeletal:  Negative for myalgias.  Skin:  Negative for rash.  Neurological:  Negative for dizziness.  Psychiatric/Behavioral:  Negative for depression.    Physical Exam BP 110/69    Pulse 84    Temp 99 F (37.2 C) (Oral)    Ht 5\' 2"  (1.575 m)    Wt 204 lb 12.8 oz (92.9 kg)    LMP 09/08/2021 (Exact Date)    SpO2 99%    BMI 37.46 kg/m  CONSTITUTIONAL: No acute distress, well-nourished HEENT:  Normocephalic, atraumatic, extraocular motion intact. NECK: Trachea is midline, and there is no jugular venous distension.  RESPIRATORY:  Lungs are clear, and breath sounds are equal bilaterally. Normal respiratory effort without pathologic use of accessory  muscles. CARDIOVASCULAR: Heart is regular without murmurs, gallops, or rubs. GI: The abdomen is soft, nondistended, currently nontender to palpation.  Negative Murphy's sign.  MUSCULOSKELETAL:  Normal muscle strength and tone in all four extremities.  No peripheral edema or cyanosis. SKIN: Skin turgor is normal. There are no pathologic skin lesions.  NEUROLOGIC:  Motor and sensation is grossly normal.  Cranial nerves are grossly intact. PSYCH:  Alert and oriented to person, place and time. Affect is  normal.  Laboratory Analysis: Labs from 09/15/2021: Sodium 137, potassium 3.8, chloride 102, CO2 26, BUN 17, creatinine 0.54.  Total bilirubin 0.4, AST 119, ALT 54, alkaline phosphatase 101, lipase 35.  WBC 12.8, hemoglobin 10.2, hematocrit 33.9, platelets 397.  Imaging: Ultrasound RUQ on 09/15/2021: IMPRESSION: 1. Cholelithiasis. No sonographic features to suggest acute cholecystitis. 2. No biliary dilatation.  Assessment and Plan: This is a 26 y.o. female with symptomatic cholelithiasis.  - Discussed with the patient the laboratory and imaging findings during her emergency room visit.  She does have symptomatic cholelithiasis and discussed with her that potentially the symptoms that she had during pregnancy could have been as a result of her cholelithiasis.  Discussed with her the options for potential watchful waiting versus surgical management and discussed with her that unfortunately there is no good medical way of getting rid of her gallstones forever.  After further discussion, she has opted for surgical management. - Reviewed with her the plan surgery for a robotic cholecystectomy with ICG cholangiogram and discussed with her the surgery at length including the risks of bleeding, infection, injury to surrounding structures, that is an outpatient procedure, postoperative pain and recovery as well as activity restrictions and she is willing to proceed. - We will schedule patient for surgery on 10/02/2021 in the afternoon.  I spent 60 minutes dedicated to the care of this patient on the date of this encounter to include pre-visit review of records, face-to-face time with the patient discussing diagnosis and management, and any post-visit coordination of care.   Melvyn Neth, [facility]

## 2021-09-23 NOTE — Patient Instructions (Signed)
Our surgery scheduler Barbara will call you within 24-48 hours to get you scheduled. If you have not heard from her after 48 hours, please call our office. You will not need to get Covid tested before surgery and have the blue sheet available when she calls to write down important information.    If you have any concerns or questions, please feel free to call our office.    Colecistectoma mnimamente invasiva Minimally Invasive Cholecystectomy  Una colecistectoma mnimamente invasiva es una ciruga que se realiza para extirpar la vescula biliar. La vescula biliar es un rgano que tiene forma de pera y se encuentra debajo del hgado, del lado derecho del cuerpo. La vescula biliar almacena bilis, un lquido que ayuda al organismo a digerir las grasas. La colecistectoma se realiza con frecuencia para tratar la inflamacin (irritacin e hinchazn) de la vescula biliar (colecistitis). Por lo general, esta afeccin se debe a una acumulacin de clculos biliares (colelitiasis) en la vescula biliar o al estancamiento del lquido de la vescula biliar a causa de que los clculos biliares se atascan en los conductos (tubos) y obstruyen el paso de la bilis. Esto puede producir inflamacin y dolor. En los casos graves, podr ser necesaria una ciruga de urgencia. Este procedimiento se realiza a travs de pequeas incisiones en el abdomen, en lugar de una incisin grande. Tambin se denomina "ciruga laparoscpica". Se introduce un endoscopio delgado que tiene una cmara (laparoscopio) a travs de una incisin. A travs de las otras incisiones, se introducen los instrumentos quirrgicos. En algunos casos, es posible que una ciruga mnimamente invasiva deba cambiarse a una ciruga realizada a travs de una incisin ms grande. Esta se denomina "ciruga abierta". Informe al mdico acerca de lo siguiente: Cualquier alergia que tenga. Todos los medicamentos que usa, incluidos vitaminas, hierbas, gotas  oftlmicas, cremas y medicamentos de venta libre. Problemas previos que usted o algn miembro de su familia hayan tenido con los anestsicos. Cualquier problema de la sangre que tenga. Cirugas a las que se haya sometido. Cualquier afeccin mdica que tenga. Si est embarazada o podra estarlo. Cules son los riesgos? En general, se trata de un procedimiento seguro. Sin embargo, pueden ocurrir complicaciones, por ejemplo: Infeccin. Sangrado. Reacciones alrgicas a los medicamentos. Daos a las estructuras o los rganos cercanos. Un clculo biliar que queda en el conducto biliar comn. El conducto coldoco transporta la bilis desde la vescula biliar hasta el intestino delgado. Una filtracin de bilis del hgado o del conducto qustico despus de que se extirpa la vescula biliar. Qu ocurre antes del procedimiento? Cundo dejar de comer y beber Siga las instrucciones del mdico con respecto a lo que puede comer y beber antes del procedimiento. Pueden incluir: Ocho horas antes del procedimiento Deje de comer la mayora de los alimentos. No coma carne, alimentos fritos ni alimentos grasos. Consuma solo alimentos livianos, como tostadas o galletas saladas. Todos los lquidos son aceptables, excepto las bebidas energticas y el alcohol. Seis horas antes del procedimiento Deje de comer. Beba nicamente lquidos transparentes, como agua, jugo de fruta transparente, caf solo, t solo y bebidas deportivas. No consuma bebidas energticas ni alcohol. Dos horas antes del procedimiento Deje de beber todos los lquidos. Es posible que le permitan tomar medicamentos con pequeos sorbos de agua. Si no sigue las instrucciones del mdico, el procedimiento puede retrasarse o cancelarse. Medicamentos Consulte al mdico si debe hacer o no lo siguiente: Cambiar o suspender los medicamentos que usa habitualmente. Esto es muy importante si toma medicamentos para   la diabetes o anticoagulantes. Tomar  medicamentos como aspirina e ibuprofeno. Estos medicamentos pueden tener un efecto anticoagulante en la sangre. No tome estos medicamentos a menos que el mdico se lo indique. Usar medicamentos de venta libre, vitaminas, hierbas y suplementos. Instrucciones generales Si va a marcharse a su casa inmediatamente despus del procedimiento, pdale a un adulto responsable que: Lo lleve a su casa desde el hospital o la clnica. No se le permitir conducir. Lo cuide durante el tiempo que le indiquen. No consuma ningn producto que contenga nicotina ni tabaco durante al menos las 4 semanas anteriores al procedimiento. Estos productos incluyen cigarrillos, tabaco para mascar y aparatos de vapeo, como los cigarrillos electrnicos. Si necesita ayuda para dejar de fumar, consulte al mdico. Pregntele al mdico: Cmo se marcar el lugar de la ciruga. Qu medidas se tomarn para evitar una infeccin. Pueden incluir: Rasurar el vello del lugar de la ciruga. Lavar la piel con un jabn antisptico. Recibir antibiticos. Qu ocurre durante el procedimiento?  Le colocarn una va intravenosa (i.v.) en una vena. Le administrarn uno de los siguientes medicamentos o ambos: Un medicamento para ayudar a relajarse (sedante). Un medicamento que lo har dormir (anestesia general). Su cirujano le har varias incisiones pequeas en el abdomen. El laparoscopio se introducir a travs de una de las pequeas incisiones. La cmara del laparoscopio enviar imgenes a un monitor que se encuentra en el quirfano. Esto permitir a su cirujano ver dentro del abdomen. Le inyectarn un gas en el abdomen. Esto expandir el abdomen para que el cirujano tenga ms lugar para hacer la ciruga. El resto del instrumental necesario para el procedimiento se introducir a travs de las otras incisiones. Se extirpar la vescula biliar a travs de una de las incisiones. Se puede examinar el conducto coldoco. Si se encuentran clculos en  la va biliar, tal vez deban extirparse. Despus de la extirpacin de la vescula biliar, se cerrarn las incisiones con puntos (suturas), grapas o goma para cerrar la piel. Las incisiones pueden cubrirse con una venda (vendaje). El procedimiento puede variar segn el mdico y el hospital. Qu ocurre despus del procedimiento? Le controlarn la presin arterial, la frecuencia cardaca, la frecuencia respiratoria y el nivel de oxgeno en la sangre hasta que le den el alta del hospital o la clnica. Le darn analgsicos para controlar el dolor, si es necesario. Es posible que le coloquen un drenaje en la incisin. Se lo retirarn uno o dos das despus del procedimiento. Resumen La colecistectoma mnimamente invasiva, tambin llamada colecistectoma laparoscpica, es una ciruga que se realiza para extirpar la vescula biliar a travs de pequeas incisiones. Informe a su mdico sobre todas las otras afecciones que tenga y sobre todos los medicamentos que est usando para dichas afecciones. Antes del procedimiento, siga las instrucciones sobre cundo dejar de comer y beber y sobre cambiar o suspender medicamentos. Haga que un adulto responsable lo cuide durante el tiempo que le indiquen despus de que le den el alta del hospital o de la clnica. Esta informacin no tiene como fin reemplazar el consejo del mdico. Asegrese de hacerle al mdico cualquier pregunta que tenga. Document Revised: 01/30/2021 Document Reviewed: 01/30/2021 Elsevier Patient Education  2022 Elsevier Inc.  

## 2021-09-23 NOTE — H&P (View-Only) (Signed)
09/23/2021  Reason for Visit:  Symptomatic cholelithiasis  History of Present Illness: Katie Olson is a 26 y.o. female presenting for evaluation of symptomatic cholelithiasis.  Patient presents emergency room on 09/15/2021 with an episode of biliary colic which was resolving by the time she was seen by a physician there.  Her episode of pain was in the right upper quadrant and was associated with nausea and vomiting.  In the emergency room, her work-up showed a WBC of 12.8, bilirubin of 0.4, AST 119, ALT 54, alkaline phosphatase 101, lipase 35.  She also had an ultrasound which showed multiple gallstones largest of which measures about 2 cm but without any gallbladder wall thickening or pericholecystic fluid.  As her episode had resolved, she was discharged home.  The patient reports that during her pregnancy she also was having issues with upper abdominal pain that she was attributing to her pregnancy itself, but there is a possibility that this could have been related to her gallbladder.  After her pregnancy, she had 1 minor episode of similar pain which was milder in nature compared to the one that brought her to the emergency room last week.  No past surgical history  Past Medical History: Past Medical History:  Diagnosis Date   Patient denies medical problems      Past Surgical History: Past Surgical History:  Procedure Laterality Date   denies      Home Medications: Prior to Admission medications   Medication Sig Start Date End Date Taking? Authorizing Provider  acetaminophen (TYLENOL) 500 MG tablet Take 1,000 mg by mouth every 6 (six) hours as needed.    [provider]  aspirin EC 81 MG tablet Take 81 mg by mouth daily. Swallow whole. Patient not taking: No sig reported    [provider]  Prenatal Vit-Fe Fumarate-FA (MULTIVITAMIN-PRENATAL) 27-0.8 MG TABS tablet Take 1 tablet by mouth daily at 12 noon.    [provider]    Allergies: No  Known Allergies  Social History:  reports that she quit smoking about 13 months ago. Her smoking use included cigarettes. She has never used smokeless tobacco. She reports that she does not drink alcohol and does not use drugs.   Family History: Family History  Problem Relation Age of Onset   Multiple births Sister        twins, "not identical"   Diabetes Maternal Grandmother    Diabetes Maternal Grandfather     Review of Systems: Review of Systems  Constitutional:  Negative for chills and fever.  HENT:  Negative for hearing loss.   Respiratory:  Negative for shortness of breath.   Cardiovascular:  Negative for chest pain.  Gastrointestinal:  Positive for abdominal pain, nausea and vomiting.  Genitourinary:  Negative for dysuria.  Musculoskeletal:  Negative for myalgias.  Skin:  Negative for rash.  Neurological:  Negative for dizziness.  Psychiatric/Behavioral:  Negative for depression.    Physical Exam BP 110/69    Pulse 84    Temp 99 F (37.2 C) (Oral)    Ht 5\' 2"  (1.575 m)    Wt 204 lb 12.8 oz (92.9 kg)    LMP 09/08/2021 (Exact Date)    SpO2 99%    BMI 37.46 kg/m  CONSTITUTIONAL: No acute distress, well-nourished HEENT:  Normocephalic, atraumatic, extraocular motion intact. NECK: Trachea is midline, and there is no jugular venous distension.  RESPIRATORY:  Lungs are clear, and breath sounds are equal bilaterally. Normal respiratory effort without pathologic use of accessory  muscles. CARDIOVASCULAR: Heart is regular without murmurs, gallops, or rubs. GI: The abdomen is soft, nondistended, currently nontender to palpation.  Negative Murphy's sign.  MUSCULOSKELETAL:  Normal muscle strength and tone in all four extremities.  No peripheral edema or cyanosis. SKIN: Skin turgor is normal. There are no pathologic skin lesions.  NEUROLOGIC:  Motor and sensation is grossly normal.  Cranial nerves are grossly intact. PSYCH:  Alert and oriented to person, place and time. Affect is  normal.  Laboratory Analysis: Labs from 09/15/2021: Sodium 137, potassium 3.8, chloride 102, CO2 26, BUN 17, creatinine 0.54.  Total bilirubin 0.4, AST 119, ALT 54, alkaline phosphatase 101, lipase 35.  WBC 12.8, hemoglobin 10.2, hematocrit 33.9, platelets 397.  Imaging: Ultrasound RUQ on 09/15/2021: IMPRESSION: 1. Cholelithiasis. No sonographic features to suggest acute cholecystitis. 2. No biliary dilatation.  Assessment and Plan: This is a 26 y.o. female with symptomatic cholelithiasis.  - Discussed with the patient the laboratory and imaging findings during her emergency room visit.  She does have symptomatic cholelithiasis and discussed with her that potentially the symptoms that she had during pregnancy could have been as a result of her cholelithiasis.  Discussed with her the options for potential watchful waiting versus surgical management and discussed with her that unfortunately there is no good medical way of getting rid of her gallstones forever.  After further discussion, she has opted for surgical management. - Reviewed with her the plan surgery for a robotic cholecystectomy with ICG cholangiogram and discussed with her the surgery at length including the risks of bleeding, infection, injury to surrounding structures, that is an outpatient procedure, postoperative pain and recovery as well as activity restrictions and she is willing to proceed. - We will schedule patient for surgery on 10/02/2021 in the afternoon.  I spent 60 minutes dedicated to the care of this patient on the date of this encounter to include pre-visit review of records, face-to-face time with the patient discussing diagnosis and management, and any post-visit coordination of care.   Melvyn Neth, [facility]

## 2021-09-24 ENCOUNTER — Telehealth: Payer: Self-pay | Admitting: Surgery

## 2021-09-24 NOTE — Telephone Encounter (Signed)
Outgoing call is made using [employer], interpreter [place], [id number], patient is informed of the following:   Pre-Admission date/time, COVID Testing date and Surgery date.  Surgery Date: 10/02/21 Preadmission Testing Date: 09/30/21 (phone 8a-1p) Covid Testing Date: Not needed.   Patient has been made aware to call [phone], between 1-3:00pm the day before surgery, to find out what time to arrive for surgery.

## 2021-09-30 ENCOUNTER — Other Ambulatory Visit
Admission: RE | Admit: 2021-09-30 | Discharge: 2021-09-30 | Disposition: A | Discharge status: Home / Self Care | Payer: Medicaid Other | Source: Ambulatory Visit | Attending: Surgery | Admitting: Surgery

## 2021-09-30 ENCOUNTER — Other Ambulatory Visit: Payer: Self-pay

## 2021-09-30 NOTE — Patient Instructions (Signed)
Your procedure is scheduled on: Wednesday October 02, 2021. ?Su procedimiento est? programado para: Miercoles 8 de Marzo del 2023. ?Report to [facility] inside [facility] 2[facility], stop by admissions desk before getting on elevator.  ?Pres?ntese a: Diplomatic Services operational officer del [facility] 2dno piso, antes registrese en admisiones antes de subir al elvador.   ?To find out your arrival time please call [phone] between 1PM - 3PM on Tuesday October 01, 2021. ?Para saber su hora de llegada por favor llame al [phone] Eusebio Me la 1PM - 3PM el d?a: Martes 7 de [place] del 2023. ? ?Remember: Instructions that are not followed completely may result in serious medical risk, up to and including death,  ?or upon the discretion of your surgeon and anesthesiologist your surgery may need to be rescheduled.  ?Recuerde: Las instrucciones que no se siguen completamente pueden resultar en un riesgo de salud grave, incluyendo hasta  ?la [place] o a discreci?n de su cirujano y anestesi?logo, su cirug?a se puede posponer. ? ? __X_ 1.Do not eat food after midnight the night before your procedure. No  ?  gum chewing or hard candies. You may drink clear liquids up to 2 hours   ?  before you are scheduled to arrive for your surgery- DO not drink clear   ?  Liquids within 2 hours of the start of your surgery.  ? ?  Clear Liquids include:  ?  water, apple juice without pulp, clear carbohydrate drink such as  ?  Clearfast of [place]art[place]ade, Black Coffee or Tea (Do not add anything to coffee or tea).   ? ?  No coma nada despu?s de la medianoche de la noche anterior a su  ?  procedimiento. No coma chicles ni caramelos duros. Puede tomar  ?  l?quidos claros hasta 2 horas antes de su hora programada de llegada al   ?  hospital para su procedimiento. No tome l?quidos claros durante el   ?  transcurso de las 2 horas de su llegada programada al hospital para su   ?  procedimiento, ya que esto puede llevar a que su procedimiento se    ?retrase o  tenga que volver a programarse. ? ?Los l?quidos claros incluyen: ?         - [place] o jugo de [place] sin pulpa ?         - Bebidas claras con carbohidratos como ClearFast o Gatorade ?         - Caf? negro o t? claro (sin leche, sin cremas, no agregue nada al caf? ni al t?) ? No tome nada que no est? en esta lista. ? Los pacientes con diabetes tipo 1 y tipo 2 solo deben [employer]. ? Llame a la cl?nica de [facility] o a la unidad de Same Day Surgery si  ?tiene alguna pregunta sobre estas instrucciones. ? ?            _X__ 2.Do Not Smoke or use e-cigarettes For 24 Hours Prior to Your Surgery.   ? Do not use any chewable tobacco products for at least 6  ? hours prior to surgery. ? ?  No fume ni use cigarrillos electr?nicos durante las 24 horas previas ?   a su cirug?a.  No use ning?n producto de tabaco masticable durante ?  al menos 6 horas antes de la cirug?a. ?   ? __X_ 3. No alcohol for 24 hours before or after surgery. ?   No tome alcohol durante las  24 horas antes ni despu?s de la cirug?a. ? ? __X__4. On the morning of surgery brush your teeth with toothpaste and water, you ?               may rinse your mouth with mouthwash if you wish.  Do not swallow any toothpaste or mouthwash. ?  En la ma?ana de la cirug?a, cep?llese los dientes con pasta de dientes y agua, ?               puede enjuagarse la boca con enjuague bucal si lo desea. No ingiera ninguna pasta de dientes o enjuague bucal. ? ? ___X_ 5. Notify your doctor if there is any change in your medical condition (cold,fever, infections). ?   Informe a su m?dico si hay alg?n cambio en su condici?n m?dica  ?(resfriado, fiebre, infecciones). ? ? Do not wear jewelry, make-up, hairpins, clips or nail polish. ? No use joyas, maquillajes, pinzas/ganchos para el cabello ni esmalte de u?as. ? Do not wear lotions, powders, or perfumes. You may wear deodorant. ? No use lociones, polvos o perfumes.  Puede usar desodorante.   ? Do not shave 48 hours prior to surgery. Men may  shave face and neck. ? No se afeite 48 horas antes de la cirug?a.  Los hombres pueden [employer] cara  y el cuello.  ? Do not bring valuables to the hospital.   ?No lleve objetos de valor al hospital. ? [facility] is not responsible for any belongings or valuables. ? [facility] no se hace responsable de ning?n tipo de pertenencias u objetos de Licensed conveyancer. ?  ?            Contacts, dentures or bridgework may not be worn into surgery. ? Los lentes de contacto, las dentaduras postizas o puentes no se pueden usar en la cirug?a. ?  ?Leave your suitcase in the car. After surgery it may be brought to your room. ? Deje su maleta en el auto.  Despu?s de la cirug?a podr? traerla a su habitaci?n. ?  ?For patients admitted to the hospital, discharge time is determined by your ? treatment team. ? Para los pacientes que sean ingresados al hospital, el tiempo en el cual se le ? dar? de alta es determinado por su equipo de tratamiento. ?  ?Patients discharged the day of surgery will not be allowed to drive home. ?A los pacientes que se les da de alta el mismo d?a de la cirug?a no se les permitir? conducir a casa. ?  ? ?__X__ Take these medicines the morning of surgery with A SIP OF WATER: ?         Tome estas medicinas la ma?ana de la cirug?a con UN SORBO DE AGUA: ? 1. Ninguna  ? 2.  ? 3.  ? 4.      ? 5. ? 6. ? ?____ Fleet Enema (as directed) ?         Enema de Fleet (seg?n lo indicado)   ? ?__X__ Use Antibacterial Soap as directed ?         Utilice el jab?n Antibacterial seg?n lo indicado ? ?____ Use inhalers on the day of surgery ?         Use los inhaladores el d?a de la cirug?a ? ?____ Stop metformin 2 days prior to surgery ?         Deje de tomar el metformin 2 d?as antes de la cirug?a   ? ?____ Take 1/2 of usual  insulin dose the night before surgery and none on the morning of surgery  ?         Tome la mitad de la dosis habitual de insulina la noche antes de la cirug?a y no tome nada en la ma?ana de la  ?            cirug?a ? ?__X__ Stop Anti-inflammatories such as Ibuprofen, Aleve, Advil, Motrin, Naprosyn, Meloxicam, Ketoralac, Midol, aspirins and or Goody's and BC powders ?         Deje de tomar antiinflamatorios como Ibuprofen, Aleve, Advil, Motrin, Naprosyn, Meloxicam, Ketoralac, Midol, aspirins y/o polvos de Goody's and BC powders ?  ?__X__ Stop supplements until after surgery   ?         Deje de tomar suplementos hasta despu?s de la cirug?a ? ?____ Bring C-Pap to the hospital ?         Lleve el C-Pap al hospital  ? ?

## 2021-10-02 ENCOUNTER — Other Ambulatory Visit: Payer: Self-pay

## 2021-10-02 ENCOUNTER — Encounter
Admission: RE | Disposition: A | Discharge status: Home / Self Care | Payer: Self-pay | Source: Home / Self Care | Attending: Surgery

## 2021-10-02 ENCOUNTER — Ambulatory Visit: Payer: Medicaid Other | Admitting: Certified Registered"

## 2021-10-02 ENCOUNTER — Encounter: Payer: Self-pay | Admitting: Surgery

## 2021-10-02 ENCOUNTER — Ambulatory Visit
Admission: RE | Admit: 2021-10-02 | Discharge: 2021-10-02 | Disposition: A | Discharge status: Home / Self Care | Payer: Medicaid Other | Attending: Surgery | Admitting: Surgery

## 2021-10-02 DIAGNOSIS — K8010 Calculus of gallbladder with chronic cholecystitis without obstruction: Secondary | ICD-10-CM | POA: Insufficient documentation

## 2021-10-02 DIAGNOSIS — Z87891 Personal history of nicotine dependence: Secondary | ICD-10-CM | POA: Insufficient documentation

## 2021-10-02 DIAGNOSIS — K8020 Calculus of gallbladder without cholecystitis without obstruction: Secondary | ICD-10-CM

## 2021-10-02 LAB — POCT PREGNANCY, URINE: Preg Test, Ur: NEGATIVE

## 2021-10-02 SURGERY — CHOLECYSTECTOMY, ROBOT-ASSISTED, LAPAROSCOPIC
Anesthesia: General | Site: Abdomen

## 2021-10-02 MED ORDER — IBUPROFEN 800 MG PO TABS: 800.0000 mg | ORAL_TABLET | Freq: Three times a day (TID) | ORAL | 1 refills | Status: DC | PRN

## 2021-10-02 MED ORDER — GABAPENTIN 300 MG PO CAPS
ORAL_CAPSULE | ORAL | Status: AC
  Filled 2021-10-02: qty 1

## 2021-10-02 MED ORDER — OXYCODONE HCL 5 MG PO TABS
ORAL_TABLET | ORAL | Status: AC
  Administered 2021-10-02: 5 mg via ORAL
  Filled 2021-10-02: qty 1

## 2021-10-02 MED ORDER — FENTANYL CITRATE (PF) 100 MCG/2ML IJ SOLN
INTRAMUSCULAR | Status: AC
  Administered 2021-10-02: 25 ug via INTRAVENOUS
  Filled 2021-10-02: qty 2

## 2021-10-02 MED ORDER — MIDAZOLAM HCL 2 MG/2ML IJ SOLN
INTRAMUSCULAR | Status: DC | PRN
  Administered 2021-10-02: 2 mg via INTRAVENOUS

## 2021-10-02 MED ORDER — CHLORHEXIDINE GLUCONATE CLOTH 2 % EX PADS: 6.0000 | MEDICATED_PAD | Freq: Once | CUTANEOUS | Status: DC

## 2021-10-02 MED ORDER — GLYCOPYRROLATE 0.2 MG/ML IJ SOLN
INTRAMUSCULAR | Status: DC | PRN
  Administered 2021-10-02: .2 mg via INTRAVENOUS

## 2021-10-02 MED ORDER — FENTANYL CITRATE (PF) 100 MCG/2ML IJ SOLN
INTRAMUSCULAR | Status: DC | PRN
  Administered 2021-10-02 (×2): 50 ug via INTRAVENOUS

## 2021-10-02 MED ORDER — GABAPENTIN 300 MG PO CAPS
300.0000 mg | ORAL_CAPSULE | ORAL | Status: AC
  Administered 2021-10-02: 300 mg via ORAL

## 2021-10-02 MED ORDER — SUCCINYLCHOLINE CHLORIDE 200 MG/10ML IV SOSY
PREFILLED_SYRINGE | INTRAVENOUS | Status: DC | PRN
  Administered 2021-10-02: 100 mg via INTRAVENOUS

## 2021-10-02 MED ORDER — LIDOCAINE HCL (CARDIAC) PF 100 MG/5ML IV SOSY
PREFILLED_SYRINGE | INTRAVENOUS | Status: DC | PRN
Start: 2021-10-02 — End: 2021-10-02
  Administered 2021-10-02: 100 mg via INTRAVENOUS

## 2021-10-02 MED ORDER — BUPIVACAINE-EPINEPHRINE (PF) 0.25% -1:200000 IJ SOLN
INTRAMUSCULAR | Status: AC
  Filled 2021-10-02: qty 30

## 2021-10-02 MED ORDER — OXYCODONE HCL 5 MG PO TABS: 5.0000 mg | ORAL_TABLET | Freq: Once | ORAL | Status: AC

## 2021-10-02 MED ORDER — MIDAZOLAM HCL 2 MG/2ML IJ SOLN
INTRAMUSCULAR | Status: AC
  Filled 2021-10-02: qty 2

## 2021-10-02 MED ORDER — INDOCYANINE GREEN 25 MG IV SOLR
2.5000 mg | INTRAVENOUS | Status: AC
  Administered 2021-10-02: 2.5 mg via INTRAVENOUS
  Filled 2021-10-02: qty 1

## 2021-10-02 MED ORDER — ACETAMINOPHEN 500 MG PO TABS
ORAL_TABLET | ORAL | Status: AC
  Filled 2021-10-02: qty 2

## 2021-10-02 MED ORDER — PROPOFOL 10 MG/ML IV BOLUS
INTRAVENOUS | Status: AC
  Filled 2021-10-02: qty 20

## 2021-10-02 MED ORDER — ACETAMINOPHEN 500 MG PO TABS: 1000.0000 mg | ORAL_TABLET | Freq: Four times a day (QID) | ORAL | Status: DC | PRN

## 2021-10-02 MED ORDER — FENTANYL CITRATE (PF) 100 MCG/2ML IJ SOLN
INTRAMUSCULAR | Status: AC
  Filled 2021-10-02: qty 2

## 2021-10-02 MED ORDER — ONDANSETRON HCL 4 MG/2ML IJ SOLN
4.0000 mg | Freq: Once | INTRAMUSCULAR | Status: AC | PRN
  Administered 2021-10-02: 4 mg via INTRAVENOUS

## 2021-10-02 MED ORDER — EPHEDRINE SULFATE (PRESSORS) 50 MG/ML IJ SOLN
INTRAMUSCULAR | Status: DC | PRN
  Administered 2021-10-02: 50 mg via INTRAVENOUS
  Administered 2021-10-02 (×2): 5 mg via INTRAVENOUS

## 2021-10-02 MED ORDER — ONDANSETRON HCL 4 MG/2ML IJ SOLN
INTRAMUSCULAR | Status: AC
  Filled 2021-10-02: qty 2

## 2021-10-02 MED ORDER — CHLORHEXIDINE GLUCONATE 0.12 % MT SOLN
15.0000 mL | Freq: Once | OROMUCOSAL | Status: AC
  Administered 2021-10-02: 15 mL via OROMUCOSAL

## 2021-10-02 MED ORDER — FENTANYL CITRATE (PF) 100 MCG/2ML IJ SOLN
25.0000 ug | INTRAMUSCULAR | Status: DC | PRN
  Administered 2021-10-02: 50 ug via INTRAVENOUS
  Administered 2021-10-02: 25 ug via INTRAVENOUS

## 2021-10-02 MED ORDER — DEXMEDETOMIDINE (PRECEDEX) IN NS 20 MCG/5ML (4 MCG/ML) IV SYRINGE
PREFILLED_SYRINGE | INTRAVENOUS | Status: DC | PRN
  Administered 2021-10-02: 12 ug via INTRAVENOUS
  Administered 2021-10-02: 20 ug via INTRAVENOUS
  Administered 2021-10-02: 8 ug via INTRAVENOUS

## 2021-10-02 MED ORDER — ORAL CARE MOUTH RINSE: 15.0000 mL | Freq: Once | OROMUCOSAL | Status: AC

## 2021-10-02 MED ORDER — ROCURONIUM BROMIDE 100 MG/10ML IV SOLN
INTRAVENOUS | Status: DC | PRN
  Administered 2021-10-02: 50 mg via INTRAVENOUS
  Administered 2021-10-02 (×2): 10 mg via INTRAVENOUS

## 2021-10-02 MED ORDER — OXYCODONE HCL 5 MG PO TABS: 5.0000 mg | ORAL_TABLET | ORAL | 0 refills | Status: DC | PRN

## 2021-10-02 MED ORDER — ONDANSETRON HCL 4 MG/2ML IJ SOLN
INTRAMUSCULAR | Status: DC | PRN
  Administered 2021-10-02 (×2): 4 mg via INTRAVENOUS

## 2021-10-02 MED ORDER — PHENYLEPHRINE 40 MCG/ML (10ML) SYRINGE FOR IV PUSH (FOR BLOOD PRESSURE SUPPORT)
PREFILLED_SYRINGE | INTRAVENOUS | Status: DC | PRN
Start: 2021-10-02 — End: 2021-10-02
  Administered 2021-10-02: 160 ug via INTRAVENOUS
  Administered 2021-10-02 (×2): 80 ug via INTRAVENOUS
  Administered 2021-10-02: 160 ug via INTRAVENOUS

## 2021-10-02 MED ORDER — CHLORHEXIDINE GLUCONATE 0.12 % MT SOLN
OROMUCOSAL | Status: AC
  Filled 2021-10-02: qty 15

## 2021-10-02 MED ORDER — PROPOFOL 10 MG/ML IV BOLUS
INTRAVENOUS | Status: DC | PRN
  Administered 2021-10-02: 200 mg via INTRAVENOUS

## 2021-10-02 MED ORDER — CEFAZOLIN SODIUM-DEXTROSE 2-4 GM/100ML-% IV SOLN
2.0000 g | INTRAVENOUS | Status: AC
  Administered 2021-10-02: 2 g via INTRAVENOUS

## 2021-10-02 MED ORDER — FAMOTIDINE 20 MG PO TABS
20.0000 mg | ORAL_TABLET | Freq: Once | ORAL | Status: AC
  Administered 2021-10-02: 20 mg via ORAL

## 2021-10-02 MED ORDER — DEXAMETHASONE SODIUM PHOSPHATE 10 MG/ML IJ SOLN
INTRAMUSCULAR | Status: DC | PRN
  Administered 2021-10-02: 10 mg via INTRAVENOUS

## 2021-10-02 MED ORDER — 0.9 % SODIUM CHLORIDE (POUR BTL) OPTIME
TOPICAL | Status: DC | PRN
  Administered 2021-10-02: 500 mL

## 2021-10-02 MED ORDER — KETOROLAC TROMETHAMINE 30 MG/ML IJ SOLN
INTRAMUSCULAR | Status: DC | PRN
  Administered 2021-10-02: 30 mg via INTRAVENOUS

## 2021-10-02 MED ORDER — CEFAZOLIN SODIUM-DEXTROSE 2-4 GM/100ML-% IV SOLN
INTRAVENOUS | Status: AC
  Filled 2021-10-02: qty 100

## 2021-10-02 MED ORDER — LACTATED RINGERS IV SOLN: INTRAVENOUS | Status: DC

## 2021-10-02 MED ORDER — ACETAMINOPHEN 500 MG PO TABS
1000.0000 mg | ORAL_TABLET | ORAL | Status: AC
  Administered 2021-10-02: 1000 mg via ORAL

## 2021-10-02 MED ORDER — FAMOTIDINE 20 MG PO TABS
ORAL_TABLET | ORAL | Status: AC
  Filled 2021-10-02: qty 1

## 2021-10-02 MED ORDER — BUPIVACAINE-EPINEPHRINE (PF) 0.25% -1:200000 IJ SOLN
INTRAMUSCULAR | Status: DC | PRN
  Administered 2021-10-02: 30 mL

## 2021-10-02 SURGICAL SUPPLY — 57 items
ADH SKN CLS APL DERMABOND .7 (GAUZE/BANDAGES/DRESSINGS) ×2
BAG INFUSER PRESSURE 100CC (MISCELLANEOUS) IMPLANT
BAG RETRIEVAL 10 (BASKET) ×1
CANNULA REDUC XI 12-8 STAPL (CANNULA) ×1
CANNULA REDUCER 12-8 DVNC XI (CANNULA) ×2 IMPLANT
CLIP LIGATING HEMO O LOK GREEN (MISCELLANEOUS) ×3 IMPLANT
CUP MEDICINE 2OZ PLAST GRAD ST (MISCELLANEOUS) ×3 IMPLANT
DERMABOND ADVANCED (GAUZE/BANDAGES/DRESSINGS) ×1
DERMABOND ADVANCED .7 DNX12 (GAUZE/BANDAGES/DRESSINGS) ×2 IMPLANT
DRAPE ARM DVNC X/XI (DISPOSABLE) ×8 IMPLANT
DRAPE COLUMN DVNC XI (DISPOSABLE) ×2 IMPLANT
DRAPE DA VINCI XI ARM (DISPOSABLE) ×4
DRAPE DA VINCI XI COLUMN (DISPOSABLE) ×1
ELECT CAUTERY BLADE TIP 2.5 (TIP) ×3
ELECT REM PT RETURN 9FT ADLT (ELECTROSURGICAL) ×3
ELECTRODE CAUTERY BLDE TIP 2.5 (TIP) ×2 IMPLANT
ELECTRODE REM PT RTRN 9FT ADLT (ELECTROSURGICAL) ×2 IMPLANT
GLOVE SURG SYN 7.0 (GLOVE) ×6 IMPLANT
GLOVE SURG SYN 7.0 PF PI (GLOVE) ×4 IMPLANT
GLOVE SURG SYN 7.5  E (GLOVE) ×2
GLOVE SURG SYN 7.5 E (GLOVE) ×4 IMPLANT
GLOVE SURG SYN 7.5 PF PI (GLOVE) ×4 IMPLANT
GOWN STRL REUS W/ TWL LRG LVL3 (GOWN DISPOSABLE) ×8 IMPLANT
GOWN STRL REUS W/TWL LRG LVL3 (GOWN DISPOSABLE) ×12
IRRIGATOR SUCT 8 DISP DVNC XI (IRRIGATION / IRRIGATOR) IMPLANT
IRRIGATOR SUCTION 8MM XI DISP (IRRIGATION / IRRIGATOR)
IV NS 1000ML (IV SOLUTION)
IV NS 1000ML BAXH (IV SOLUTION) IMPLANT
KIT PINK PAD W/HEAD ARE REST (MISCELLANEOUS) ×3
KIT PINK PAD W/HEAD ARM REST (MISCELLANEOUS) ×2 IMPLANT
LABEL OR SOLS (LABEL) ×3 IMPLANT
MANIFOLD NEPTUNE II (INSTRUMENTS) ×3 IMPLANT
NEEDLE HYPO 22GX1.5 SAFETY (NEEDLE) ×3 IMPLANT
NS IRRIG 500ML POUR BTL (IV SOLUTION) ×3 IMPLANT
OBTURATOR OPTICAL STANDARD 8MM (TROCAR) ×1
OBTURATOR OPTICAL STND 8 DVNC (TROCAR) ×2
OBTURATOR OPTICALSTD 8 DVNC (TROCAR) ×2 IMPLANT
PACK LAP CHOLECYSTECTOMY (MISCELLANEOUS) ×3 IMPLANT
PENCIL ELECTRO HAND CTR (MISCELLANEOUS) ×3 IMPLANT
SEAL CANN UNIV 5-8 DVNC XI (MISCELLANEOUS) ×6 IMPLANT
SEAL XI 5MM-8MM UNIVERSAL (MISCELLANEOUS) ×3
SET TUBE SMOKE EVAC HIGH FLOW (TUBING) ×3 IMPLANT
SOLUTION ELECTROLUBE (MISCELLANEOUS) ×3 IMPLANT
SPIKE FLUID TRANSFER (MISCELLANEOUS) ×3 IMPLANT
SPONGE T-LAP 18X18 ~~LOC~~+RFID (SPONGE) IMPLANT
SPONGE T-LAP 4X18 ~~LOC~~+RFID (SPONGE) ×3 IMPLANT
STAPLER CANNULA SEAL DVNC XI (STAPLE) ×2 IMPLANT
STAPLER CANNULA SEAL XI (STAPLE) ×1
SUT MNCRL AB 4-0 PS2 18 (SUTURE) ×3 IMPLANT
SUT VIC AB 3-0 SH 27 (SUTURE)
SUT VIC AB 3-0 SH 27X BRD (SUTURE) IMPLANT
SUT VICRYL 0 AB UR-6 (SUTURE) ×6 IMPLANT
SYS BAG RETRIEVAL 10MM (BASKET) ×2
SYSTEM BAG RETRIEVAL 10MM (BASKET) ×2 IMPLANT
TAPE TRANSPORE STRL 2 31045 (GAUZE/BANDAGES/DRESSINGS) ×3 IMPLANT
TROCAR BALLN GELPORT 12X130M (ENDOMECHANICALS) ×3 IMPLANT
WATER STERILE IRR 500ML POUR (IV SOLUTION) ×3 IMPLANT

## 2021-10-02 NOTE — Discharge Instructions (Signed)

## 2021-10-02 NOTE — Anesthesia Procedure Notes (Signed)
Procedure Name: Intubation ?Date/Time: 10/02/2021 1:22 PM ?Performed by: Mohammed Kindle, CRNA ?Pre-anesthesia Checklist: Patient identified, Emergency Drugs available, Suction available and Patient being monitored ?Patient Re-evaluated:Patient Re-evaluated prior to induction ?Oxygen Delivery Method: Circle system utilized ?Preoxygenation: Pre-oxygenation with 100% oxygen ?Induction Type: IV induction ?Ventilation: Mask ventilation without difficulty ?Laryngoscope Size: McGraph and 3 ?Grade View: Grade I ?Tube type: Oral ?Tube size: 6.5 mm ?Number of attempts: 1 ?Airway Equipment and Method: Stylet and Oral airway ?Placement Confirmation: ETT inserted through vocal cords under direct vision, positive ETCO2, breath sounds checked- equal and bilateral and CO2 detector ?Secured at: 21 cm ?Tube secured with: Tape ?Dental Injury: Teeth and Oropharynx as per pre-operative assessment  ? ? ? ? ?

## 2021-10-02 NOTE — Transfer of Care (Signed)
Immediate Anesthesia Transfer of Care Note ? ?Patient: Katie Olson ? ?Procedure(s) Performed: Procedure(s): ?XI ROBOTIC ASSISTED LAPAROSCOPIC CHOLECYSTECTOMY (N/A) ?INDOCYANINE GREEN FLUORESCENCE IMAGING (ICG) (N/A) ? ?Patient Location: PACU ? ?Anesthesia Type:General ? ?Level of Consciousness: sedated ? ?Airway & Oxygen Therapy: Patient Spontanous Breathing and Patient connected to face mask oxygen ? ?Post-op Assessment: Report given to RN and Post -op Vital signs reviewed and stable ? ?Post vital signs: Reviewed and stable ? ?Last Vitals:  ?Vitals:  ? 10/02/21 1307 10/02/21 1506  ?BP: 118/75 122/69  ?Pulse: 71   ?Resp: 14 (!) 26  ?Temp: (!) 36.3 ?C (!) 36.3 ?C  ?SpO2: 99% 100%  ? ? ?Complications: No apparent anesthesia complications ?

## 2021-10-02 NOTE — Interval H&P Note (Signed)
History and Physical Interval Note: ? ?10/02/2021 ?12:22 PM ? ?Katie Olson  has presented today for surgery, with the diagnosis of symptomatic cholelithiasis.  The various methods of treatment have been discussed with the patient and family. After consideration of risks, benefits and other options for treatment, the patient has consented to  Procedure(s): ?XI ROBOTIC ASSISTED LAPAROSCOPIC CHOLECYSTECTOMY (N/A) ?INDOCYANINE GREEN FLUORESCENCE IMAGING (ICG) (N/A) as a surgical intervention.  The patient's history has been reviewed, patient examined, no change in status, stable for surgery.  I have reviewed the patient's chart and labs.  Questions were answered to the patient's satisfaction.   ? ? ?Katie Olson ? ? ?

## 2021-10-02 NOTE — Op Note (Signed)
?  Procedure Date:  10/02/2021 ? ?Pre-operative Diagnosis:  Symptomatic cholelithiasis ? ?Post-operative Diagnosis: Symptomatic cholelithiasis ? ?Procedure:  Robotic assisted cholecystectomy with ICG FireFly cholangiogram ? ?Surgeon:  Melvyn Neth, MD ? ?Anesthesia:  General endotracheal ? ?Estimated Blood Loss:  5 ml ? ?Specimens:  gallbladder ? ?Complications:  None ? ?Indications for Procedure:  This is a 26 y.o. female who presents with abdominal pain and workup revealing symptomatic cholelithiasis.  The benefits, complications, treatment options, and expected outcomes were discussed with the patient. The risks of bleeding, infection, recurrence of symptoms, failure to resolve symptoms, bile duct damage, bile duct leak, retained common bile duct stone, bowel injury, and need for further procedures were all discussed with the patient and she was willing to proceed. ? ?Description of Procedure: ?The patient was correctly identified in the preoperative area and brought into the operating room.  The patient was placed supine with VTE prophylaxis in place.  Appropriate time-outs were performed.  Anesthesia was induced and the patient was intubated.  Appropriate antibiotics were infused. ? ?The abdomen was prepped and draped in a sterile fashion. An infraumbilical incision was made. A cutdown technique was used to enter the abdominal cavity without injury, and a 12 mm robotic port was inserted.  Pneumoperitoneum was obtained with appropriate opening pressures.  Three 8-mm ports were placed in the mid abdomen at the level of the umbilicus under direct visualization.  The DaVinci platform was docked, camera targeted, and instruments were placed under direct visualization. ? ?The gallbladder was identified.  The fundus was grasped and retracted cephalad.  Adhesions were lysed bluntly and with electrocautery. The infundibulum was grasped and retracted laterally, exposing the peritoneum overlying the gallbladder.  This  was incised with electrocautery and extended on either side of the gallbladder.  FireFly cholangiogram was then obtained, and we were able to clearly identify the cystic duct and common bile duct.  The cystic duct and cystic artery were carefully dissected with combination of cautery and blunt dissection.  Both were clipped twice proximally and once distally, cutting in between.  The gallbladder was taken from the gallbladder fossa in a retrograde fashion with electrocautery. The gallbladder was placed in an Endocatch bag. The liver bed was inspected and any bleeding was controlled with electrocautery. The right upper quadrant was then inspected again revealing intact clips, no bleeding, and no ductal injury.  The area was thoroughly irrigated. ? ?The 8 mm ports were removed under direct visualization and the 12 mm port was removed.  The Endocatch bag was brought out via the umbilical incision. The fascial opening was closed using 0 vicryl suture.  Local anesthetic was infused in all incisions and the incisions were closed with 4-0 Monocryl.  The wounds were cleaned and sealed with DermaBond. ? ?The patient was emerged from anesthesia and extubated and brought to the recovery room for further management. ? ?The patient tolerated the procedure well and all counts were correct at the end of the case. ? ? ?Melvyn Neth, MD ? ? ?  ?

## 2021-10-02 NOTE — Anesthesia Preprocedure Evaluation (Signed)
Anesthesia Evaluation  ?Patient identified by MRN, date of birth, ID band ?Patient awake ? ? ? ?Reviewed: ?Allergy & Precautions, H&P , NPO status , Patient's Chart, lab work & pertinent test results, reviewed documented beta blocker date and time  ? ?History of Anesthesia Complications ?Negative for: history of anesthetic complications ? ?Airway ?Mallampati: II ? ?TM Distance: >3 FB ?Neck ROM: full ? ? ? Dental ? ?(+) Dental Advidsory Given, Teeth Intact ?  ?Pulmonary ?neg pulmonary ROS, Patient abstained from smoking., former smoker,  ?  ?Pulmonary exam normal ?breath sounds clear to auscultation ? ? ? ? ? ? Cardiovascular ?Exercise Tolerance: Good ?negative cardio ROS ?Normal cardiovascular exam ?Rhythm:regular Rate:Normal ? ? ?  ?Neuro/Psych ?negative neurological ROS ? negative psych ROS  ? GI/Hepatic ?negative GI ROS, Neg liver ROS,   ?Endo/Other  ?negative endocrine ROS ? Renal/GU ?negative Renal ROS  ?negative genitourinary ?  ?Musculoskeletal ? ? Abdominal ?  ?Peds ? Hematology ?negative hematology ROS ?(+)   ?Anesthesia Other Findings ?Past Medical History: ?No date: Patient denies medical problems ? ?Obesity ? Reproductive/Obstetrics ?negative OB ROS ? ?  ? ? ? ? ? ? ? ? ? ? ? ? ? ?  ?  ? ? ? ? ? ? ? ? ?Anesthesia Physical ?Anesthesia Plan ? ?ASA: 2 ? ?Anesthesia Plan: General  ? ?Post-op Pain Management:   ? ?Induction: Intravenous ? ?PONV Risk Score and Plan: 3 and Ondansetron, Dexamethasone, Midazolam and Treatment may vary due to age or medical condition ? ?Airway Management Planned: Oral ETT ? ?Additional Equipment:  ? ?Intra-op Plan:  ? ?Post-operative Plan: Extubation in OR ? ?Informed Consent: I have reviewed the patients History and Physical, chart, labs and discussed the procedure including the risks, benefits and alternatives for the proposed anesthesia with the patient or authorized representative who has indicated his/her understanding and acceptance.   ? ? ? ?Dental Advisory Given ? ?Plan Discussed with: Anesthesiologist, CRNA and Surgeon ? ?Anesthesia Plan Comments:   ? ? ? ? ? ? ?Anesthesia Quick Evaluation ? ?

## 2021-10-03 NOTE — Anesthesia Postprocedure Evaluation (Signed)
Anesthesia Post Note ? ?Patient: Katie Olson ? ?Procedure(s) Performed: XI ROBOTIC ASSISTED LAPAROSCOPIC CHOLECYSTECTOMY (Abdomen) ?INDOCYANINE GREEN FLUORESCENCE IMAGING (ICG) ? ?Patient location during evaluation: PACU ?Anesthesia Type: General ?Level of consciousness: awake and alert ?Pain management: pain level controlled ?Vital Signs Assessment: post-procedure vital signs reviewed and stable ?Respiratory status: spontaneous breathing, nonlabored ventilation, respiratory function stable and patient connected to nasal cannula oxygen ?Cardiovascular status: blood pressure returned to baseline and stable ?Postop Assessment: no apparent nausea or vomiting ?Anesthetic complications: no ? ? ?No notable events documented. ? ? ?Last Vitals:  ?Vitals:  ? 10/02/21 1545 10/02/21 1613  ?BP: 118/78 (!) 102/59  ?Pulse: 76 79  ?Resp: 15 16  ?Temp: (!) 36.3 ?C   ?SpO2: 97% 98%  ?  ?Last Pain:  ?Vitals:  ? 10/02/21 1545  ?TempSrc:   ?PainSc: 6   ? ? ?  ?  ?  ?  ?  ?  ? ?Lenard Simmer ? ? ? ? ?

## 2021-10-04 LAB — SURGICAL PATHOLOGY

## 2021-10-16 ENCOUNTER — Other Ambulatory Visit: Payer: Self-pay

## 2021-10-16 ENCOUNTER — Encounter: Payer: Self-pay | Admitting: Surgery

## 2021-10-16 ENCOUNTER — Ambulatory Visit (INDEPENDENT_AMBULATORY_CARE_PROVIDER_SITE_OTHER): Payer: Medicaid Other | Admitting: Surgery

## 2021-10-16 VITALS — BP 117/75 | HR 69 | Temp 98.5°F | Ht 62.0 in | Wt 211.2 lb

## 2021-10-16 DIAGNOSIS — Z09 Encounter for follow-up examination after completed treatment for conditions other than malignant neoplasm: Secondary | ICD-10-CM

## 2021-10-16 DIAGNOSIS — K8020 Calculus of gallbladder without cholecystitis without obstruction: Secondary | ICD-10-CM

## 2021-10-16 NOTE — Progress Notes (Signed)
10/16/2021 ? ?HPI: ?Katie Olson is a 26 y.o. female s/p robotic assisted cholecystectomy on 10/02/21.  Patient presents for follow up.  Reports she's been doing well.  She initially had some bloatedness/diarrhea the first few days but that has improved.  She also reports having a bit of dizziness with the oxycodone, so she's been taking mostly ibuprofen or tylenol.   ? ?Vital signs: ?BP 117/75   Pulse 69   Temp 98.5 ?F (36.9 ?C) (Oral)   Ht 5\' 2"  (1.575 m)   Wt 211 lb 3.2 oz (95.8 kg)   LMP 09/21/2021 (Exact Date)   SpO2 97%   BMI 38.63 kg/m?   ? ?Physical Exam: ?Constitutional: No acute distress ?Abdomen: Soft, nondistended, nontender to palpation.  Incisions are clean, dry, intact and healing well with no evidence of infection. ? ?Assessment/Plan: ?This is a 26 y.o. female s/p robotic assisted cholecystectomy. ? ?- Discussed with patient that bloatedness and diarrhea is not uncommon after having gallbladder surgery as the body still adjusting to not having a gallbladder.  She may try her regular foods now to see if how her body is doing and if there is more bloating or issues like that, to hold off for a few more weeks as a precaution. ?- Patient is healing well from the incisions discussed with her that if for some reason she will to have a little bit more pain for the Tylenol or ibuprofen, she could try cutting the dose of oxycodone in half and see if that may provide her some pain relief without the side effects of dizziness. ?- Otherwise for now, follow-up as needed. ? ? ?25, MD ?[facility]  ?

## 2021-10-16 NOTE — Patient Instructions (Addendum)
If you have any concerns or questions, please feel free to call our office. Follow up as needed.  ? ?GENERAL POST-OPERATIVE ?PATIENT INSTRUCTIONS  ? ?WOUND CARE INSTRUCTIONS:  Keep a dry clean dressing on the wound if there is drainage. The initial bandage may be removed after 24 hours.  Once the wound has quit draining you may leave it open to air.  If clothing rubs against the wound or causes irritation and the wound is not draining you may cover it with a dry dressing during the daytime.  Try to keep the wound dry and avoid ointments on the wound unless directed to do so.  If the wound becomes bright red and painful or starts to drain infected material that is not clear, please contact your physician immediately.  If the wound is mildly pink and has a thick firm ridge underneath it, this is normal, and is referred to as a healing ridge.  This will resolve over the next 4-6 weeks. ? ?BATHING: ?You may shower if you have been informed of this by your surgeon. However, Please do not submerge in a tub, hot tub, or pool until incisions are completely sealed or have been told by your surgeon that you may do so. ? ?DIET:  You may eat any foods that you can tolerate.  It is a good idea to eat a high fiber diet and take in plenty of fluids to prevent constipation.  If you do become constipated you may want to take a mild laxative or take ducolax tablets on a daily basis until your bowel habits are regular.  Constipation can be very uncomfortable, along with straining, after recent surgery. ? ?ACTIVITY:  You are encouraged to cough and deep breath or use your incentive spirometer if you were given one, every 15-30 minutes when awake.  This will help prevent respiratory complications and low grade fevers post-operatively if you had a general anesthetic.  You may want to hug a pillow when coughing and sneezing to add additional support to the surgical area, if you had abdominal or chest surgery, which will decrease pain  during these times.  You are encouraged to walk and engage in light activity for the next two weeks.  You should not lift, push or pull more than 15-20 pounds for 4 weeks total (10/30/2021) after surgery as it could put you at increased risk for complications.  Twenty pounds is roughly equivalent to a plastic bag of groceries. At that time- Listen to your body when lifting, if you have pain when lifting, stop and then try again in a few days. Soreness after doing exercises or activities of daily living is normal as you get back in to your normal routine. ? ?MEDICATIONS:  Try to take narcotic medications and anti-inflammatory medications, such as tylenol, ibuprofen, naprosyn, etc., with food.  This will minimize stomach upset from the medication.  Should you develop nausea and vomiting from the pain medication, or develop a rash, please discontinue the medication and contact your physician.  You should not drive, make important decisions, or operate machinery when taking narcotic pain medication. ? ?SUNBLOCK ?Use sun block to incision area over the next year if this area will be exposed to sun. This helps decrease scarring and will allow you avoid a permanent darkened area over your incision. ? ?QUESTIONS:  Please feel free to call our office if you have any questions, and we will be glad to assist you. [phone] ? ? ?

## 2021-12-05 ENCOUNTER — Emergency Department: Payer: Medicaid Other

## 2021-12-05 ENCOUNTER — Inpatient Hospital Stay: Payer: Medicaid Other

## 2021-12-05 ENCOUNTER — Inpatient Hospital Stay
Admission: EM | Admit: 2021-12-05 | Discharge: 2021-12-07 | DRG: 871 | Disposition: A | Discharge status: Home / Self Care | Payer: Medicaid Other | Attending: Hospitalist | Admitting: Hospitalist

## 2021-12-05 ENCOUNTER — Encounter: Payer: Self-pay | Admitting: Emergency Medicine

## 2021-12-05 DIAGNOSIS — Z6839 Body mass index (BMI) 39.0-39.9, adult: Secondary | ICD-10-CM

## 2021-12-05 DIAGNOSIS — A419 Sepsis, unspecified organism: Principal | ICD-10-CM

## 2021-12-05 DIAGNOSIS — R4182 Altered mental status, unspecified: Secondary | ICD-10-CM | POA: Diagnosis not present

## 2021-12-05 DIAGNOSIS — Z833 Family history of diabetes mellitus: Secondary | ICD-10-CM

## 2021-12-05 DIAGNOSIS — Z87891 Personal history of nicotine dependence: Secondary | ICD-10-CM

## 2021-12-05 DIAGNOSIS — B085 Enteroviral vesicular pharyngitis: Secondary | ICD-10-CM | POA: Diagnosis present

## 2021-12-05 DIAGNOSIS — B084 Enteroviral vesicular stomatitis with exanthem: Secondary | ICD-10-CM | POA: Diagnosis present

## 2021-12-05 DIAGNOSIS — G43909 Migraine, unspecified, not intractable, without status migrainosus: Secondary | ICD-10-CM | POA: Diagnosis present

## 2021-12-05 DIAGNOSIS — G9341 Metabolic encephalopathy: Secondary | ICD-10-CM | POA: Diagnosis present

## 2021-12-05 DIAGNOSIS — R6520 Severe sepsis without septic shock: Secondary | ICD-10-CM | POA: Diagnosis present

## 2021-12-05 DIAGNOSIS — A4189 Other specified sepsis: Principal | ICD-10-CM | POA: Diagnosis present

## 2021-12-05 DIAGNOSIS — R509 Fever, unspecified: Secondary | ICD-10-CM | POA: Diagnosis present

## 2021-12-05 DIAGNOSIS — Z20822 Contact with and (suspected) exposure to covid-19: Secondary | ICD-10-CM | POA: Diagnosis present

## 2021-12-05 DIAGNOSIS — E669 Obesity, unspecified: Secondary | ICD-10-CM | POA: Diagnosis present

## 2021-12-05 LAB — LACTIC ACID, PLASMA
Lactic Acid, Venous: 3.2 mmol/L (ref 0.5–1.9)
Lactic Acid, Venous: 1 mmol/L (ref 0.5–1.9)

## 2021-12-05 LAB — URINALYSIS, ROUTINE W REFLEX MICROSCOPIC
Bacteria, UA: NONE SEEN
Bilirubin Urine: NEGATIVE
Glucose, UA: NEGATIVE mg/dL
Hgb urine dipstick: NEGATIVE
Ketones, ur: 20 mg/dL — AB
Leukocytes,Ua: NEGATIVE
Nitrite: NEGATIVE
Protein, ur: 30 mg/dL — AB
Specific Gravity, Urine: 1.019 (ref 1.005–1.030)
pH: 9 — ABNORMAL HIGH (ref 5.0–8.0)

## 2021-12-05 LAB — CSF CELL COUNT WITH DIFFERENTIAL
RBC Count, CSF: 0 /mm3 (ref 0–3)
RBC Count, CSF: 2 /mm3 (ref 0–3)
Tube #: 1
Tube #: 4
WBC, CSF: 5 /mm3 (ref 0–5)
WBC, CSF: 5 /mm3 (ref 0–5)

## 2021-12-05 LAB — COMPREHENSIVE METABOLIC PANEL
ALT: 20 U/L (ref 0–44)
AST: 32 U/L (ref 15–41)
Albumin: 3.8 g/dL (ref 3.5–5.0)
Alkaline Phosphatase: 82 U/L (ref 38–126)
Anion gap: 8 (ref 5–15)
BUN: 8 mg/dL (ref 6–20)
CO2: 22 mmol/L (ref 22–32)
Calcium: 8.6 mg/dL — ABNORMAL LOW (ref 8.9–10.3)
Chloride: 107 mmol/L (ref 98–111)
Creatinine, Ser: 0.74 mg/dL (ref 0.44–1.00)
GFR, Estimated: >60 mL/min (ref >60)
Glucose, Bld: 110 mg/dL — ABNORMAL HIGH (ref 70–99)
Potassium: 3.5 mmol/L (ref 3.5–5.1)
Sodium: 137 mmol/L (ref 135–145)
Total Bilirubin: 0.6 mg/dL (ref 0.3–1.2)
Total Protein: 7.9 g/dL (ref 6.5–8.1)

## 2021-12-05 LAB — CBC
HCT: 35.4 % — ABNORMAL LOW (ref 36.0–46.0)
Hemoglobin: 10.9 g/dL — ABNORMAL LOW (ref 12.0–15.0)
MCH: 21.5 pg — ABNORMAL LOW (ref 26.0–34.0)
MCHC: 30.8 g/dL (ref 30.0–36.0)
MCV: 69.7 fL — ABNORMAL LOW (ref 80.0–100.0)
Platelets: 400 10*3/uL (ref 150–400)
RBC: 5.08 MIL/uL (ref 3.87–5.11)
RDW: 18.3 % — ABNORMAL HIGH (ref 11.5–15.5)
WBC: 14.7 10*3/uL — ABNORMAL HIGH (ref 4.0–10.5)
nRBC: 0 % (ref 0.0–0.2)

## 2021-12-05 LAB — BLOOD GAS, ARTERIAL
Acid-base deficit: 1.8 mmol/L (ref 0.0–2.0)
Bicarbonate: 23.1 mmol/L (ref 20.0–28.0)
FIO2: 40 %
MECHVT: 420 mL
O2 Saturation: 99.8 %
PEEP: 5 cmH2O
Patient temperature: 37
RATE: 20 resp/min
pCO2 arterial: 39 mmHg (ref 32–48)
pH, Arterial: 7.38 (ref 7.35–7.45)
pO2, Arterial: 160 mmHg — ABNORMAL HIGH (ref 83–108)

## 2021-12-05 LAB — LIPASE, BLOOD: Lipase: 27 U/L (ref 11–51)

## 2021-12-05 LAB — RESP PANEL BY RT-PCR (FLU A&B, COVID) ARPGX2
Influenza A by PCR: NEGATIVE
Influenza B by PCR: NEGATIVE
SARS Coronavirus 2 by RT PCR: NEGATIVE

## 2021-12-05 LAB — MAGNESIUM: Magnesium: 1.8 mg/dL (ref 1.7–2.4)

## 2021-12-05 LAB — PROTEIN AND GLUCOSE, CSF
Glucose, CSF: 62 mg/dL (ref 40–70)
Total  Protein, CSF: 13 mg/dL — ABNORMAL LOW (ref 15–45)

## 2021-12-05 LAB — HCG, QUANTITATIVE, PREGNANCY: hCG, Beta Chain, Quant, S: 1 m[IU]/mL (ref <5)

## 2021-12-05 LAB — MRSA NEXT GEN BY PCR, NASAL: MRSA by PCR Next Gen: NOT DETECTED

## 2021-12-05 LAB — URINE DRUG SCREEN, QUALITATIVE (ARMC ONLY)
Amphetamines, Ur Screen: NOT DETECTED
Barbiturates, Ur Screen: NOT DETECTED
Benzodiazepine, Ur Scrn: NOT DETECTED
Cannabinoid 50 Ng, Ur ~~LOC~~: NOT DETECTED
Cocaine Metabolite,Ur ~~LOC~~: NOT DETECTED
MDMA (Ecstasy)Ur Screen: NOT DETECTED
Methadone Scn, Ur: NOT DETECTED
Opiate, Ur Screen: NOT DETECTED
Phencyclidine (PCP) Ur S: NOT DETECTED
Tricyclic, Ur Screen: NOT DETECTED

## 2021-12-05 LAB — APTT: aPTT: 39 seconds — ABNORMAL HIGH (ref 24–36)

## 2021-12-05 LAB — PROTIME-INR
INR: 1.2 (ref 0.8–1.2)
Prothrombin Time: 14.9 seconds (ref 11.4–15.2)

## 2021-12-05 LAB — HEMOGLOBIN A1C
Hgb A1c MFr Bld: 5.3 % (ref 4.8–5.6)
Mean Plasma Glucose: 105.41 mg/dL

## 2021-12-05 LAB — PROCALCITONIN: Procalcitonin: <0.1 ng/mL

## 2021-12-05 LAB — GLUCOSE, CAPILLARY: Glucose-Capillary: 94 mg/dL (ref 70–99)

## 2021-12-05 MED ORDER — MIDAZOLAM HCL 2 MG/2ML IJ SOLN: 2.0000 mg | INTRAMUSCULAR | Status: DC | PRN

## 2021-12-05 MED ORDER — POLYETHYLENE GLYCOL 3350 17 G PO PACK: 17.0000 g | PACK | Freq: Every day | ORAL | Status: DC

## 2021-12-05 MED ORDER — DEXTROSE 5 % IV SOLN
15.0000 mg/kg | Freq: Once | INTRAVENOUS | Status: DC
  Filled 2021-12-05: qty 20.9

## 2021-12-05 MED ORDER — DOCUSATE SODIUM 50 MG/5ML PO LIQD
100.0000 mg | Freq: Two times a day (BID) | ORAL | Status: DC
  Filled 2021-12-05 (×4): qty 10

## 2021-12-05 MED ORDER — GADOBUTROL 1 MMOL/ML IV SOLN
9.0000 mL | Freq: Once | INTRAVENOUS | Status: AC | PRN
  Administered 2021-12-05: 7.5 mL via INTRAVENOUS

## 2021-12-05 MED ORDER — CHLORHEXIDINE GLUCONATE CLOTH 2 % EX PADS
6.0000 | MEDICATED_PAD | Freq: Every day | CUTANEOUS | Status: DC
Start: 2021-12-05 — End: 2021-12-07
  Administered 2021-12-05 – 2021-12-07 (×2): 6 via TOPICAL

## 2021-12-05 MED ORDER — PROPOFOL 1000 MG/100ML IV EMUL
5.0000 ug/kg/min | INTRAVENOUS | Status: DC
  Administered 2021-12-05: 15 ug/kg/min via INTRAVENOUS
  Administered 2021-12-05: 25 ug/kg/min via INTRAVENOUS
  Filled 2021-12-05 (×2): qty 100

## 2021-12-05 MED ORDER — LIDOCAINE-EPINEPHRINE 2 %-1:100000 IJ SOLN
20.0000 mL | Freq: Once | INTRAMUSCULAR | Status: AC
  Administered 2021-12-05: 20 mL
  Filled 2021-12-05: qty 1

## 2021-12-05 MED ORDER — ONDANSETRON HCL 4 MG/2ML IJ SOLN: 4.0000 mg | Freq: Four times a day (QID) | INTRAMUSCULAR | Status: DC | PRN

## 2021-12-05 MED ORDER — FENTANYL 2500MCG IN NS 250ML (10MCG/ML) PREMIX INFUSION
50.0000 ug/h | INTRAVENOUS | Status: DC
  Administered 2021-12-05: 100 ug/h via INTRAVENOUS

## 2021-12-05 MED ORDER — MIDAZOLAM HCL 2 MG/2ML IJ SOLN
INTRAMUSCULAR | Status: AC
  Administered 2021-12-05: 2 mg via INTRAVENOUS
  Filled 2021-12-05: qty 2

## 2021-12-05 MED ORDER — ENOXAPARIN SODIUM 60 MG/0.6ML IJ SOSY
0.5000 mg/kg | PREFILLED_SYRINGE | INTRAMUSCULAR | Status: DC
  Administered 2021-12-05: 50 mg via SUBCUTANEOUS
  Filled 2021-12-05: qty 0.5
  Filled 2021-12-05: qty 0.6

## 2021-12-05 MED ORDER — LACTATED RINGERS IV BOLUS
1000.0000 mL | Freq: Once | INTRAVENOUS | Status: AC
  Administered 2021-12-05: 1000 mL via INTRAVENOUS

## 2021-12-05 MED ORDER — FENTANYL CITRATE PF 50 MCG/ML IJ SOSY
50.0000 ug | PREFILLED_SYRINGE | Freq: Once | INTRAMUSCULAR | Status: DC
Start: 2021-12-05 — End: 2021-12-05

## 2021-12-05 MED ORDER — DEXAMETHASONE SODIUM PHOSPHATE 10 MG/ML IJ SOLN: 10.0000 mg | Freq: Four times a day (QID) | INTRAMUSCULAR | Status: DC

## 2021-12-05 MED ORDER — FENTANYL CITRATE PF 50 MCG/ML IJ SOSY
100.0000 ug | PREFILLED_SYRINGE | Freq: Once | INTRAMUSCULAR | Status: AC
  Administered 2021-12-05: 100 ug via INTRAVENOUS
  Filled 2021-12-05: qty 2

## 2021-12-05 MED ORDER — FENTANYL 2500MCG IN NS 250ML (10MCG/ML) PREMIX INFUSION
100.0000 ug/h | INTRAVENOUS | Status: DC
  Administered 2021-12-05: 100 ug/h via INTRAVENOUS
  Filled 2021-12-05: qty 250

## 2021-12-05 MED ORDER — DEXTROSE 5 % IV SOLN
10.0000 mg/kg | Freq: Three times a day (TID) | INTRAVENOUS | Status: DC
  Administered 2021-12-05 – 2021-12-06 (×5): 695 mg via INTRAVENOUS
  Filled 2021-12-05 (×6): qty 13.9

## 2021-12-05 MED ORDER — SODIUM CHLORIDE 0.9 % IV SOLN: 2.0000 g | Freq: Two times a day (BID) | INTRAVENOUS | Status: DC

## 2021-12-05 MED ORDER — SODIUM CHLORIDE 0.9 % IV SOLN
2.0000 g | Freq: Once | INTRAVENOUS | Status: AC
  Administered 2021-12-05: 2 g via INTRAVENOUS
  Filled 2021-12-05: qty 20

## 2021-12-05 MED ORDER — PANTOPRAZOLE 2 MG/ML SUSPENSION
40.0000 mg | Freq: Every day | ORAL | Status: DC
  Filled 2021-12-05: qty 20

## 2021-12-05 MED ORDER — MORPHINE SULFATE (PF) 4 MG/ML IV SOLN
4.0000 mg | Freq: Once | INTRAVENOUS | Status: AC
  Administered 2021-12-05: 4 mg via INTRAVENOUS
  Filled 2021-12-05: qty 1

## 2021-12-05 MED ORDER — DOCUSATE SODIUM 100 MG PO CAPS: 100.0000 mg | ORAL_CAPSULE | Freq: Two times a day (BID) | ORAL | Status: DC | PRN

## 2021-12-05 MED ORDER — FENTANYL BOLUS VIA INFUSION
50.0000 ug | INTRAVENOUS | Status: DC | PRN
  Filled 2021-12-05: qty 100

## 2021-12-05 MED ORDER — LACTATED RINGERS IV SOLN: INTRAVENOUS | Status: DC

## 2021-12-05 MED ORDER — VANCOMYCIN HCL 2000 MG/400ML IV SOLN
2000.0000 mg | Freq: Once | INTRAVENOUS | Status: AC
  Administered 2021-12-05: 2000 mg via INTRAVENOUS
  Filled 2021-12-05: qty 400

## 2021-12-05 MED ORDER — POLYETHYLENE GLYCOL 3350 17 G PO PACK: 17.0000 g | PACK | Freq: Every day | ORAL | Status: DC | PRN

## 2021-12-05 MED ORDER — ROCURONIUM BROMIDE 10 MG/ML (PF) SYRINGE
100.0000 mg | PREFILLED_SYRINGE | Freq: Once | INTRAVENOUS | Status: AC
Start: 2021-12-05 — End: 2021-12-05
  Administered 2021-12-05: 100 mg via INTRAVENOUS
  Filled 2021-12-05: qty 10

## 2021-12-05 MED ORDER — PANTOPRAZOLE SODIUM 40 MG IV SOLR
40.0000 mg | Freq: Every day | INTRAVENOUS | Status: DC
  Administered 2021-12-05 – 2021-12-06 (×2): 40 mg via INTRAVENOUS
  Filled 2021-12-05 (×2): qty 10

## 2021-12-05 MED ORDER — ACETAMINOPHEN 10 MG/ML IV SOLN
1000.0000 mg | Freq: Four times a day (QID) | INTRAVENOUS | Status: AC
  Administered 2021-12-05 – 2021-12-06 (×4): 1000 mg via INTRAVENOUS
  Filled 2021-12-05 (×5): qty 100

## 2021-12-05 MED ORDER — ETOMIDATE 2 MG/ML IV SOLN
20.0000 mg | Freq: Once | INTRAVENOUS | Status: AC
  Administered 2021-12-05: 20 mg via INTRAVENOUS
  Filled 2021-12-05: qty 10

## 2021-12-05 NOTE — Progress Notes (Signed)
Pt was suctioned for a small amount of secretions. Per Dr's order, she was extubated without incident. She is voicing and is on RA Sao2 is 100%. ?

## 2021-12-05 NOTE — ED Provider Notes (Addendum)
? ?[facility] ?Provider Note ? ? ? Event Date/Time  ? First MD Initiated Contact with Patient 12/05/21 0453   ?  (approximate) ? ? ?History  ? ?Fever ? ? ?HPI ? ?Katie Olson is a 26 y.o. female who presents to the ED for evaluation of Fever ?  ?Review surgical clinic visit from 3/22.  Cholecystectomy 3/28 due to symptomatic cholelithiasis.  Otherwise obese patient without much medical history. ? ?She presents with her husband today for evaluation of about 24 hours of progressively worsening fever, headache and emesis.  Husband reports that she was complaining of fever and not feeling well yesterday, developing headache during the day and having multiple episodes of nonbloody nonbilious emesis.  She woke this morning confused, vomiting and keeping her bilateral wrists flexed, unable to walk.  No falls or injuries. ? ?Patient is unable to provide any relevant history due to her confusion.  Repeatedly saying that she is in pain and complaining of headache. ?All history is obtained by the husband at the bedside. ?Facilitated by Spanish interpreter ? ?Physical Exam  ? ?Triage Vital Signs: ?ED Triage Vitals  ?Enc Vitals Group  ?   BP   ?   Pulse   ?   Resp   ?   Temp   ?   Temp src   ?   SpO2   ?   Weight   ?   Height   ?   Head Circumference   ?   Peak Flow   ?   Pain Score   ?   Pain Loc   ?   Pain Edu?   ?   Excl. in GC?   ? ? ?Most recent vital signs: ?Vitals:  ? 12/05/21 0640 12/05/21 0650  ?BP: (!) 95/53 (!) 110/59  ?Pulse: (!) 116 (!) 103  ?Resp: (!) 22 (!) 22  ?Temp:    ?SpO2: 100% 100%  ? ? ?General: Delirious, somnolent.  Follows some basic commands with repeated encouragement such as opening her mouth or her eyes, does not elevate any extremities or follow more complex commands.  Shifting in bed with seemingly symmetric use of bilateral upper and lower extremities without apparent laterality or deficits.  Hot to the touch.  Repeatedly saying that she is in pain. ?CV:  Good  peripheral perfusion.  Tachycardic and regular ?Resp:  Tachypneic with clear lungs ?Abd:  No distention.  Soft throughout ?MSK:  No deformity noted.  No rashes or deformity appreciated. ?Neuro:  No focal deficits appreciated.  Pupils midrange and PERRL. ?Other:  Lets me range her neck without apparent stiffness or wincing to suggest pain. ? ? ?ED Results / Procedures / Treatments  ? ?Labs ?(all labs ordered are listed, but only abnormal results are displayed) ?Labs Reviewed  ?COMPREHENSIVE METABOLIC PANEL - Abnormal; Notable for the following components:  ?    Result Value  ? Glucose, Bld 110 (*)   ? Calcium 8.6 (*)   ? All other components within normal limits  ?CBC - Abnormal; Notable for the following components:  ? WBC 14.7 (*)   ? Hemoglobin 10.9 (*)   ? HCT 35.4 (*)   ? MCV 69.7 (*)   ? MCH 21.5 (*)   ? RDW 18.3 (*)   ? All other components within normal limits  ?URINALYSIS, ROUTINE W REFLEX MICROSCOPIC - Abnormal; Notable for the following components:  ? Color, Urine YELLOW (*)   ? APPearance CLEAR (*)   ? pH  9.0 (*)   ? Ketones, ur 20 (*)   ? Protein, ur 30 (*)   ? All other components within normal limits  ?LACTIC ACID, PLASMA - Abnormal; Notable for the following components:  ? Lactic Acid, Venous 3.2 (*)   ? All other components within normal limits  ?APTT - Abnormal; Notable for the following components:  ? aPTT 39 (*)   ? All other components within normal limits  ?PROTEIN AND GLUCOSE, CSF - Abnormal; Notable for the following components:  ? Total  Protein, CSF 13 (*)   ? All other components within normal limits  ?RESP PANEL BY RT-PCR (FLU A&B, COVID) [id number]  ?CULTURE, BLOOD (ROUTINE X 2)  ?CULTURE, BLOOD (ROUTINE X 2)  ?URINE CULTURE  ?CSF CULTURE W GRAM STAIN  ?LIPASE, BLOOD  ?PROTIME-INR  ?HCG, QUANTITATIVE, PREGNANCY  ?LACTIC ACID, PLASMA  ?PROCALCITONIN  ?CSF CELL COUNT WITH DIFFERENTIAL  ?CSF CELL COUNT WITH DIFFERENTIAL  ?HIV ANTIBODY (ROUTINE TESTING W REFLEX)  ? ? ?EKG ?Sinus tachycardia with  rate of 123 bpm.  Normal axis and intervals.  No evidence of acute ischemia. ? ?RADIOLOGY ?CXR reviewed by me without evidence of acute cardiopulmonary pathology. ?CT head reviewed by me without evidence of acute intracranial pathology ? ?Official radiology report(s): ?CT HEAD WO CONTRAST (5MM) ? ?Result Date: 12/05/2021 ?CLINICAL DATA:  Fever, headache and septic. Concern for encephalitis/meningitis. EXAM: CT HEAD WITHOUT CONTRAST TECHNIQUE: Contiguous axial images were obtained from the base of the skull through the vertex without intravenous contrast. RADIATION DOSE REDUCTION: This exam was performed according to the departmental dose-optimization program which includes automated exposure control, adjustment of the mA and/or kV according to patient size and/or use of iterative reconstruction technique. COMPARISON:  None Available. FINDINGS: Brain: No evidence of acute infarction, hemorrhage, hydrocephalus, extra-axial collection or mass lesion/mass effect. Vascular: No hyperdense vessel or unexpected calcification. Skull: Normal. Negative for fracture or focal lesion. Sinuses/Orbits: Mastoid air cells and paranasal sinuses are clear. Other: None IMPRESSION: No acute intracranial abnormalities. Electronically Signed   By: Signa Kellaylor  Stroud M.D.   On: 12/05/2021 06:34  ? ?DG Chest Port 1 View ? ?Result Date: 12/05/2021 ?CLINICAL DATA:  Concern for sepsis.  Evaluate for abnormality. EXAM: PORTABLE CHEST 1 VIEW COMPARISON:  None Available. FINDINGS: The heart size and mediastinal contours are within normal limits. Both lungs are clear. The visualized skeletal structures are unremarkable. IMPRESSION: No active disease. Electronically Signed   By: Signa Kellaylor  Stroud M.D.   On: 12/05/2021 06:03   ? ?PROCEDURES and INTERVENTIONS: ? ?.1-3 Lead EKG Interpretation ?Performed by: Delton PrairieSmith, Favor Hackler, MD ?Authorized by: Delton PrairieSmith, Yuktha Kerchner, MD  ? ?  Interpretation: abnormal   ?  ECG rate:  120 ?  ECG rate assessment: tachycardic   ?  Rhythm: sinus  tachycardia   ?  Ectopy: none   ?  Conduction: normal   ?.Critical Care ?Performed by: Delton PrairieSmith, Olando Willems, MD ?Authorized by: Delton PrairieSmith, Maelys Kinnick, MD  ? ?Critical care provider statement:  ?  Critical care time (minutes):  75 ?  Critical care time was exclusive of:  Separately billable procedures and treating other patients ?  Critical care was necessary to treat or prevent imminent or life-threatening deterioration of the following conditions:  Sepsis ?  Critical care was time spent personally by me on the following activities:  Development of treatment plan with patient or surrogate, discussions with consultants, evaluation of patient's response to treatment, examination of patient, ordering and review of laboratory studies, ordering and review of radiographic studies,  ordering and performing treatments and interventions, pulse oximetry, re-evaluation of patient's condition and review of old charts ?Procedure Name: Intubation ?Date/Time: 12/05/2021 7:14 AM ?Performed by: Delton Prairie, MD ?Pre-anesthesia Checklist: Patient identified, Patient being monitored, Emergency Drugs available, Timeout performed and Suction available ?Oxygen Delivery Method: Non-rebreather mask ?Preoxygenation: Pre-oxygenation with 100% oxygen ?Induction Type: Rapid sequence ?Ventilation: Mask ventilation without difficulty ?Laryngoscope Size: Glidescope and 4 ?Grade View: Grade I ?Tube size: 7.5 mm ?Number of attempts: 1 ?Airway Equipment and Method: Rigid stylet ?Placement Confirmation: ETT inserted through vocal cords under direct vision, CO2 detector and Breath sounds checked- equal and bilateral ?Secured at: 24 cm ?Tube secured with: ETT holder ? ? ? ?.Lumbar Puncture ? ?Date/Time: 12/05/2021 7:15 AM ?Performed by: Delton Prairie, MD ?Authorized by: Delton Prairie, MD  ? ?Consent:  ?  Consent obtained:  Emergent situation ?  Consent given by:  Spouse ?  Risks, benefits, and alternatives were discussed: yes   ?Pre-procedure details:  ?  Procedure purpose:   Diagnostic ?  Preparation: Patient was prepped and draped in usual sterile fashion   ?Sedation:  ?  Sedation type:  Deep ?Procedure details:  ?  Lumbar space:  L4-L5 interspace ?  Patient position:  L late

## 2021-12-05 NOTE — Progress Notes (Signed)
Pt was transported to MRI from CCU and to CT and back to CCU while on the vent. ?

## 2021-12-05 NOTE — Progress Notes (Signed)
Pt taken to CT on the vent and returned to ED 17 without incident. Pt remains on the vent and is tol well at this time. ?

## 2021-12-05 NOTE — Consult Note (Signed)
Neurology Consultation ?Reason for Consult: c/f meningitis  ?Requesting Physician: Freda Jackson ? ?CC:  ? ?History is obtained from: Chart review and sister-in-law at bedside  ? ?HPI: Katie Olson is a 26 y.o. female, spanish speaking, BMI 39.87, recent cholecystectomy (3/28) due to symptomatic cholelithiasis ? ?Per ED provider notes, she presented with 24 hours of headache, emesis, worsening fever, and altered mental status with confusion, keeping her wrist flexed and being unable to ambulate.  On arrival to the ED she was unable to provide history but this was provided by her husband, she was simply stating that she was in pain and complaining of headache.  She was able to follow some simple commands with encouragement (open mouth or eyes, but not appendicular commands).  She required intubation for work-up as she was unable to tolerate work-up otherwise.  CT head was negative and LP was performed, with results as below.  She was started on ceftriaxone 2 g every 12 hours, acyclovir 10 mg/kg every 8 hours, and vancomycin for meningitis coverage. ? ?Sister-in-law confirms this history, notes patient was recovering well from her recent procedure, began to have vomiting and fever for which they attempted to give her Tylenol and TheraFlu which she vomited up.  Initially the patient did not want to come to the hospital for further evaluation, trying to tough it out at home, but then due to worsening headache she did ask her husband to take her in.  No recent known sick contacts, last travel was to [place] 2 weeks ago.  The patient did grow up in [place] but was born in the [place], and does not speak [place]. ? ?ROS: Unable to obtain due to altered mental status.  ? ?Past Medical History:  ?Diagnosis Date  ? Patient denies medical problems   ?-See HPI above ? ?Past Surgical History:  ?Procedure Laterality Date  ? denies    ?-See HPI above ? ?Current Facility-Administered Medications:  ?  acetaminophen (OFIRMEV)  IV 1,000 mg, 1,000 mg, Intravenous, Q6H, Vladimir Crofts, MD, Stopped at 12/05/21 0609 ?  acyclovir (ZOVIRAX) 695 mg in dextrose 5 % 100 mL IVPB, 10 mg/kg (Adjusted), Intravenous, Q8H, Vladimir Crofts, MD, Stopped at 12/05/21 [phone] ?  cefTRIAXone (ROCEPHIN) 2 g in sodium chloride 0.9 % 100 mL IVPB, 2 g, Intravenous, Q12H, Darel Hong D, NP ?  dexamethasone (DECADRON) injection 10 mg, 10 mg, Intravenous, Q6H, Bradly Bienenstock, NP ?  docusate (COLACE) 50 MG/5ML liquid 100 mg, 100 mg, Per Tube, BID, Darel Hong D, NP ?  docusate sodium (COLACE) capsule 100 mg, 100 mg, Oral, BID PRN, Bradly Bienenstock, NP ?  enoxaparin (LOVENOX) injection 50 mg, 0.5 mg/kg, Subcutaneous, Q24H, Darel Hong D, NP ?  fentaNYL (SUBLIMAZE) bolus via infusion 50-100 mcg, 50-100 mcg, Intravenous, Q15 min PRN, Darel Hong D, NP ?  fentaNYL 2538mcg in NS 252mL (54mcg/ml) infusion-PREMIX, 50-200 mcg/hr, Intravenous, Continuous, Darel Hong D, NP ?  lactated ringers infusion, , Intravenous, Continuous, Darel Hong D, NP ?  midazolam (VERSED) injection 2 mg, 2 mg, Intravenous, Q15 min PRN, Darel Hong D, NP ?  midazolam (VERSED) injection 2 mg, 2 mg, Intravenous, Q2H PRN, Darel Hong D, NP, 2 mg at 12/05/21 [id number] ?  ondansetron (ZOFRAN) injection 4 mg, 4 mg, Intravenous, Q6H PRN, Bradly Bienenstock, NP ?  pantoprazole (PROTONIX) injection 40 mg, 40 mg, Intravenous, Daily, Darel Hong D, NP ?  polyethylene glycol (MIRALAX / GLYCOLAX) packet 17 g, 17 g, Per Tube, Daily, Bradly Bienenstock, NP ?  polyethylene glycol (MIRALAX / GLYCOLAX) packet 17 g, 17 g, Oral, Daily PRN, Darel Hong D, NP ?  propofol (DIPRIVAN) 1000 MG/100ML infusion, 5-80 mcg/kg/min, Intravenous, Continuous, Vladimir Crofts, MD, Last Rate: 14.84 mL/hr at 12/05/21 0725, 25 mcg/kg/min at 12/05/21 0725 ?  vancomycin (VANCOREADY) IVPB 2000 mg/400 mL, 2,000 mg, Intravenous, Once, Vladimir Crofts, MD, Last Rate: 200 mL/hr at 12/05/21 0833, 2,000 mg at 12/05/21  0833 ? ?Current Outpatient Medications:  ?  acetaminophen (TYLENOL) 500 MG tablet, Take 2 tablets (1,000 mg total) by mouth every 6 (six) hours as needed for mild pain., Disp: , Rfl:  ? ? ?Family History  ?Problem Relation Age of Onset  ? Multiple births Sister   ?     twins, "not identical"  ? Diabetes Maternal Grandmother   ? Diabetes Maternal Grandfather   ? ? ?Social History:  reports that she quit smoking about 15 months ago. Her smoking use included cigarettes. She has never used smokeless tobacco. She reports that she does not drink alcohol and does not use drugs. ? ? ?Exam: ?Current vital signs: ?BP 112/72   Pulse (!) 116   Temp (!) 103.2 ?F (39.6 ?C) (Rectal)   Resp (!) 25   Ht 5\' 2"  (1.575 m)   Wt 98.9 kg   SpO2 99%   BMI 39.87 kg/m?  ?Vital signs in last 24 hours: ?Temp:  [103.2 ?F (39.6 ?C)] 103.2 ?F (39.6 ?C) (05/11 0502) ?Pulse Rate:  [96-133] 116 (05/11 0730) ?Resp:  [16-33] 25 (05/11 0730) ?BP: (83-161)/(38-121) 112/72 (05/11 0730) ?SpO2:  [99 %-100 %] 99 % (05/11 0730) ?FiO2 (%):  [40 %] 40 % (05/11 [id number]) ?Weight:  [98.9 kg] 98.9 kg (05/11 0545) ? ? ?Physical Exam  ?Constitutional: Appears well-developed and well-nourished.  ?Psych: Affect appropriate to situation, flat, and cooperative ?Eyes: No scleral injection ?HENT: ETT in place   ?MSK: no joint deformities.  ?Cardiovascular: Normal rate and regular rhythm. Perfusing extremities well ?Respiratory: Effort normal, non-labored breathing ?GI: Soft.  No distension. There is no tenderness.  ?Skin: Warm dry and intact visible skin ? ?Neuro: ?Mental Status: ?Patient is awake, alert, follows appendicular commands (she has a thumbs up and 2 fingers).  She is able to answer yes/no questions appropriately (denies headache or abdominal pain but reports her throat is uncomfortable.  History is limited secondary to language barrier and no translator immediately available as well as ET tube in place, within this limit, no signs of aphasia or  neglect ?Cranial Nerves: ?II: Orients to stimuli in all visual fields, pupils are equal, and round, 2 mm in the light ?III,IV, VI: EOMI without ptosis  ?V: Facial sensation is symmetric to light eyelash brush ?VII: Facial movement is symmetric within limits of ET tube in place.  ?VIII: hearing is intact to voice ?X: Uvula unable to assess secondary to tube ?Motor: ?Tone is normal. Bulk is normal.  No pronator drift in the bilateral upper extremities.  Able to maintain the bilateral lower extremities antigravity for 5-second count and moves them equally to command ?Sensory: ?Reports sensation intact to light touch in all 4 extremities and equal ?Reflexes: ?2+ and symmetric in the patellae, brisk, 3+ and symmetric in the brachioradialis and biceps ?Toes are mute bilaterally ? ?I have reviewed labs in epic and the results pertinent to this consultation are: ?CMP unremarkable with resolution of prior LFT elevations, creatinine 0.74  ?CBC notable for leukocytosis to 14.7, mild microcytic anemia to 10.  9 (baseline), normal platelets 400 ?Mildly elevated PTT  at 64, otherwise coags are normal ?UA with ketones and slight proteinuria, otherwise unremarkable, UDS negative ? ?CSF cell counts performed on tubes 1 and 4 ?RBC 0/5, WBC 5/5, glucose 62 (serum 110), protein 13 ? Cells too few to count so no diff available  ? ?Unresulted Labs (From admission, onward)  ? ?  Start     Ordered  ? 12/06/21 0500  Procalcitonin  Daily,   URGENT     ? 12/05/21 0507  ? 12/06/21 0500  CBC  Daily,   R     ? 12/05/21 0805  ? 12/06/21 [id number]  Basic metabolic panel  Daily,   R     ? 12/05/21 0805  ? 12/05/21 0725  Hemoglobin A1c  Add-on,   AD       ? 12/05/21 0724  ? 12/05/21 0612  HIV Antibody (routine testing w rflx)  (Meningitis Panel)  Once,   URGENT       ? 12/05/21 0612  ? 12/05/21 0507  Blood Culture (routine x 2)  (Undifferentiated presentation (screening labs and basic nursing orders))  BLOOD CULTURE X 2,   STAT     ? 12/05/21 0507  ?  12/05/21 0507  Urine Culture  (Undifferentiated presentation (screening labs and basic nursing orders))  ONCE - URGENT,   URGENT       ?Question:  Indication  Answer:  Sepsis  ? 12/05/21 0507  ? ?  ?  ? ?  ? ? ?I have reviewed

## 2021-12-05 NOTE — Progress Notes (Signed)
Pharmacy Antibiotic Note ? ?Katie Olson is a 26 y.o. 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  Pharmacy has been consulted for Acyclovir dosing. ? ?AdjBW = 69.6 kg  ? ?CrCl = 119.1 ml/min ? ?Plan: ?Acyclovir 700 mg (10 mg/kg AdjBW) IV Q8H ordered to start on 5/11 @ 0700.  ? ?Height: 5\' 2"  (157.5 cm) ?Weight: 98.9 kg (218 lb) ?IBW/kg (Calculated) : 50.1 ? ?Temp (24hrs), Avg:103.2 ?F (39.6 ?C), Min:103.2 ?F (39.6 ?C), Max:103.2 ?F (39.6 ?C) ? ?Recent Labs  ?Lab 12/05/21 ?02/04/22  ?WBC 14.7*  ?CREATININE 0.74  ?LATICACIDVEN 3.2*  ?  ?Estimated Creatinine Clearance: 119.1 mL/min (by C-G formula based on SCr of 0.74 mg/dL).   ? ?No Known Allergies ? ?Antimicrobials this admission: ?  >>  ?  >>  ? ?Dose adjustments this admission: ? ? ?Microbiology results: ? BCx:  ? UCx:   ? Sputum:   ? MRSA PCR:  ? ?Thank you for allowing pharmacy to be a part of this patient?s care. ? ?Ransome Helwig D ?12/05/2021 6:33 AM ? ?

## 2021-12-05 NOTE — Procedures (Signed)
Patient Name: Katie Olson  ?MRN: [id number]  ?Epilepsy Attending: Charlsie Quest  ?Referring Physician/Provider: Gordy Councilman, MD ?Date: 12/05/2021 ?Duration: 30.43 mins ? ?Patient history:26yo F patient presenting with fever and altered mental status. EEG to evaluate for seizure ? ?Level of alertness: Awake, asleep ? ?AEDs during EEG study: None ? ?Technical aspects: This EEG study was done with scalp electrodes positioned according to the 10-20 International system of electrode placement. Electrical activity was acquired at a sampling rate of 500Hz  and reviewed with a high frequency filter of 70Hz  and a low frequency filter of 1Hz . EEG data were recorded continuously and digitally stored.  ? ?Description: The posterior dominant rhythm consists of 9 Hz activity of moderate voltage (25-35 uV) seen predominantly in posterior head regions, symmetric and reactive to eye opening and eye closing. Sleep was characterized by vertex waves, sleep spindles (12 to 14 Hz), maximal frontocentral region. Hyperventilation and photic stimulation were not performed.    ? ?IMPRESSION: ?This study is within normal limits. No seizures or epileptiform discharges were seen throughout the recording. ? ?  ? ?

## 2021-12-05 NOTE — ED Triage Notes (Addendum)
Pt with spouse who reports pt has had N/V, fever x1 day and this AM after vomiting pt stated she could not feel arms and was unable to walk. In route pts left hand and fingers contracted. Spouse reports no previous deformity known that contraction is new. Emesis this AM x5. Last Tylenol taken at midnight.  ?

## 2021-12-05 NOTE — H&P (Signed)
? ?NAME:  Katie Olson, MRN:  [id number], DOB:  02-Mar-1996, LOS: 0 ?ADMISSION DATE:  12/05/2021, CONSULTATION DATE:  12/05/2021 ?REFERRING MD:  Dr. Tamala Julian, CHIEF COMPLAINT:  Fever, headache, altered mental status ? ?Brief Pt Description / Synopsis:  ?26 year old female admitted with acute metabolic encephalopathy and severe sepsis in the setting of suspected meningitis versus encephalitis.  Required intubation for airway protection and to facilitate work-up and treatment. ? ?History of Present Illness:  ?Katie Olson is a 26 year old female with no past medical history who presents to [facility] ED on 12/05/2021 due to complaints of high fevers, headache, vomiting, and altered mental status. ? ?On, the patient is currently intubated and sedated, therefore history is obtained from patient's husband at bedside utilizing [place] interpreter along with chart review.  Per patient's husband, she has had about 24 hours of headache, worsening fever, and multiple episodes of non-bloody emesis.  This morning she became more confused and altered and required encouragement to be able to follow simple commands.  Her husband denies any known recent sick contacts, recent viral/ear/sinus symptoms.  Denies any known chest pain, shortness of breath, cough, hematemesis, dysuria. ? ?Of note she recently underwent cholecystectomy on 10/22/2021 due to symptomatic cholelithiasis. ? ?ED Course: ?Initial Vital Signs: Temperature 103.2 ?F, respiratory rate 33, pulse 123, blood pressure 131/85, SPO2 100% on room air ?Significant Labs: Glucose 110, WBC 14.7, lactic acid 3.2, procalcitonin less than 0.10, hemoglobin 10.9, hematocrit 35.4, MCV 69.7, MCH 21.5, RDW 18.3, hemoglobin A1c 5.3, hCG 1 ?COVID-19 and influenza PCR negative ?Urinalysis not consistent with UTI ?Urine drug screen is negative ?Imaging ?Chest X-ray>> no active disease ?CT head without contrast>>IMPRESSION: No acute intracranial abnormalities. ?Medications Administered: 2 L LR  boluses, ceftriaxone 2 g, acyclovir, vancomycin ? ?Given her altered mental status, she required intubation to precipitate work-up and initial treatment.  She met sepsis criteria therefore she was given IV fluids and broad-spectrum antibiotics.  Blood and urine cultures were obtained, and LP was performed by ED provider. ? ?PCCM is asked to admit the patient to ICU.  Neurology is consulted. ? ?Pertinent  Medical History  ? ?Past Medical History:  ?Diagnosis Date  ? Patient denies medical problems   ? ? ?Micro Data:  ?5/11: SARS-CoV-2 and influenza PCR: Negative ?5/11: Blood culture x2: ?5/11: Urine: ?5/11: CSF: ?5/11: HSV, VZV (CSF)>> ? ?Antimicrobials:  ?Acyclovir 5/11>> ?Ceftriaxone 5/11 x 1 dose ?Vancomycin 5/11 x 1 dose ? ?Significant Hospital Events: ?Including procedures, antibiotic start and stop dates in addition to other pertinent events   ?5/11: Presented to ED, required intubation in ED to precipitate work-up and treatment in the setting of altered mental status.  PCCM asked to admit.  Neurology consulted.  LP performed by ED provider ? ?Interim History / Subjective:  ?-Patient seen and examined in the ED ?-Patient is lightly sedated, will wake up and intermittently reach for ET tube ?-Hemodynamically stable, no vasopressors ?-LP performed by ED provider already ?-We will consult neurology ? ?Objective   ?Blood pressure 112/72, pulse (!) 116, temperature (!) 103.2 ?F (39.6 ?C), temperature source Rectal, resp. rate (!) 25, height '5\' 2"'  (1.575 m), weight 98.9 kg, SpO2 99 %, not currently breastfeeding. ?   ?Vent Mode: AC ?FiO2 (%):  [40 %] 40 % ?Set Rate:  [20 bmp] 20 bmp ?Vt Set:  [420 mL-450 mL] 420 mL ?PEEP:  [5 cmH20] 5 cmH20  ?No intake or output data in the 24 hours ending 12/05/21 0821 ?Filed Weights  ? 12/05/21 0545  ?  Weight: 98.9 kg  ? ? ?Examination: ?General: Acutely ill-appearing female, laying in bed, intubated sedated, no acute distress ?HENT: Atraumatic, normocephalic, no JVD ?Lungs: Clear  to auscultation bilaterally, even, synchronous with the vent, nonlabored ?Cardiovascular: Tachycardia, regular rhythm, S1-S2, no murmurs, rubs, gallops ?Abdomen: Soft, nontender, nondistended, no guarding rebound tenderness, bowel sounds positive x4 ?Extremities: Normal bulk and tone, no deformities, no edema ?Neuro: Sedated, has intermittent purposeful movements, however not currently following commands, pupils PERRLA ?GU: Foley catheter in place draining yellow urine ? ?Resolved Hospital Problem list   ?N/A ? ?Assessment & Plan:  ? ?Acute metabolic encephalopathy due to suspected meningitis vs encephalitis ?Sedation needs in the setting of mechanical ventilation ?-Maintain a RASS goal of 0 to -1 ?-Propofol and fentanyl as needed to maintain RASS goal ?-Avoid sedating medications as able ?-Daily wake up assessment ?-Neurology consulted, appreciate input ?-Initial CT head negative for acute intracranial abnormality ?-Urine drug screen is negative ?-MRI and EEG pending ?-Treatment for suspected meningitis as outlined below ? ?Severe sepsis due to suspected meningitis ?-Monitor fever curve ?-Trend WBC's & Procalcitonin ?-Follow cultures as above ?-Initial analysis of CSS with low concern for bacterial meningitis at this time, could be early viral meningitis ?-Discussed with neurology, will continue empiric Acyclovir pending cultures & sensitivities (DC ceftriaxone and vancomycin due to low suspicion for bacterial meningitis) ?-Chest x-ray without evidence of pneumonia, urinalysis is negative, no signs of cellulitis ?-Will obtain CT abdomen and pelvis to rule out intra-abdominal source (low suspicion) given high fevers and vomiting ? ?Intubated for airway protection & to allow for work-up/treatment ?-Full vent support, implement lung protective strategies ?-Plateau pressures less than 30 cm H20 ?-Wean FiO2 & PEEP as tolerated to maintain O2 sats >92% ?-Follow intermittent Chest X-ray & ABG as needed ?-Spontaneous  Breathing Trials when respiratory parameters met and mental status permits ?-Implement VAP Bundle ?-Prn Bronchodilators ? ? ? ?Best Practice (right click and "Reselect all SmartList Selections" daily)  ? ?Diet/type: NPO ?DVT prophylaxis: LMWH ?GI prophylaxis: PPI ?Lines: N/A ?Foley:  Yes, and it is still needed ?Code Status:  full code ?Last date of multidisciplinary goals of care discussion [12/05/21] ? ? ?Patient's husband updated at bedside 5/11 utilizing Spanish interpreter ? ?Labs   ?CBC: ?Recent Labs  ?Lab 12/05/21 ?9242  ?WBC 14.7*  ?HGB 10.9*  ?HCT 35.4*  ?MCV 69.7*  ?PLT 400  ? ? ?Basic Metabolic Panel: ?Recent Labs  ?Lab 12/05/21 ?6834  ?NA 137  ?K 3.5  ?CL 107  ?CO2 22  ?GLUCOSE 110*  ?BUN 8  ?CREATININE 0.74  ?CALCIUM 8.6*  ? ?GFR: ?Estimated Creatinine Clearance: 119.1 mL/min (by C-G formula based on SCr of 0.74 mg/dL). ?Recent Labs  ?Lab 12/05/21 ?1962 12/05/21 ?0715  ?PROCALCITON <0.10  --   ?WBC 14.7*  --   ?LATICACIDVEN 3.2* 1.0  ? ? ?Liver Function Tests: ?Recent Labs  ?Lab 12/05/21 ?2297  ?AST 32  ?ALT 20  ?ALKPHOS 82  ?BILITOT 0.6  ?PROT 7.9  ?ALBUMIN 3.8  ? ?Recent Labs  ?Lab 12/05/21 ?9892  ?LIPASE 27  ? ?No results for input(s): AMMONIA in the last 168 hours. ? ?ABG ?No results found for: PHART, PCO2ART, PO2ART, HCO3, TCO2, ACIDBASEDEF, O2SAT  ? ?Coagulation Profile: ?Recent Labs  ?Lab 12/05/21 ?1194  ?INR 1.2  ? ? ?Cardiac Enzymes: ?No results for input(s): CKTOTAL, CKMB, CKMBINDEX, TROPONINI in the last 168 hours. ? ?HbA1C: ?No results found for: HGBA1C ? ?CBG: ?No results for input(s): GLUCAP in the last 168 hours. ? ?Review  of Systems:   ?Unable to assess due to altered mental status, sedation, and intubation ? ?Past Medical History:  ?She,  has a past medical history of Patient denies medical problems.  ? ?Surgical History:  ? ?Past Surgical History:  ?Procedure Laterality Date  ? denies    ?  ? ?Social History:  ? reports that she quit smoking about 15 months ago. Her smoking use included  cigarettes. She has never used smokeless tobacco. She reports that she does not drink alcohol and does not use drugs.  ? ?Family History:  ?Her family history includes Diabetes in her maternal grandfather and

## 2021-12-05 NOTE — Progress Notes (Signed)
Eeg done 

## 2021-12-06 DIAGNOSIS — A419 Sepsis, unspecified organism: Secondary | ICD-10-CM | POA: Diagnosis not present

## 2021-12-06 DIAGNOSIS — R6520 Severe sepsis without septic shock: Secondary | ICD-10-CM | POA: Diagnosis not present

## 2021-12-06 DIAGNOSIS — B084 Enteroviral vesicular stomatitis with exanthem: Secondary | ICD-10-CM

## 2021-12-06 LAB — RESPIRATORY PANEL BY PCR

## 2021-12-06 LAB — CBC
HCT: 31.1 % — ABNORMAL LOW (ref 36.0–46.0)
Hemoglobin: 9.4 g/dL — ABNORMAL LOW (ref 12.0–15.0)
MCH: 21.5 pg — ABNORMAL LOW (ref 26.0–34.0)
MCHC: 30.2 g/dL (ref 30.0–36.0)
MCV: 71.2 fL — ABNORMAL LOW (ref 80.0–100.0)
Platelets: 335 10*3/uL (ref 150–400)
RBC: 4.37 MIL/uL (ref 3.87–5.11)
RDW: 18.6 % — ABNORMAL HIGH (ref 11.5–15.5)
WBC: 5.8 10*3/uL (ref 4.0–10.5)
nRBC: 0 % (ref 0.0–0.2)

## 2021-12-06 LAB — HIV ANTIBODY (ROUTINE TESTING W REFLEX): HIV Screen 4th Generation wRfx: NONREACTIVE

## 2021-12-06 LAB — BASIC METABOLIC PANEL
Anion gap: 4 — ABNORMAL LOW (ref 5–15)
BUN: <5 mg/dL — ABNORMAL LOW (ref 6–20)
CO2: 25 mmol/L (ref 22–32)
Calcium: 8.1 mg/dL — ABNORMAL LOW (ref 8.9–10.3)
Chloride: 112 mmol/L — ABNORMAL HIGH (ref 98–111)
Creatinine, Ser: 0.35 mg/dL — ABNORMAL LOW (ref 0.44–1.00)
GFR, Estimated: >60 mL/min (ref >60)
Glucose, Bld: 95 mg/dL (ref 70–99)
Potassium: 3 mmol/L — ABNORMAL LOW (ref 3.5–5.1)
Sodium: 141 mmol/L (ref 135–145)

## 2021-12-06 LAB — URINE CULTURE: Culture: NO GROWTH

## 2021-12-06 LAB — GROUP A STREP BY PCR: Group A Strep by PCR: NOT DETECTED

## 2021-12-06 LAB — PROCALCITONIN: Procalcitonin: 0.15 ng/mL

## 2021-12-06 LAB — CHLAMYDIA/NGC RT PCR (ARMC ONLY)
Chlamydia Tr: NOT DETECTED
N gonorrhoeae: NOT DETECTED

## 2021-12-06 MED ORDER — MAGNESIUM SULFATE 2 GM/50ML IV SOLN
2.0000 g | Freq: Once | INTRAVENOUS | Status: AC
  Administered 2021-12-06: 2 g via INTRAVENOUS
  Filled 2021-12-06: qty 50

## 2021-12-06 MED ORDER — POTASSIUM CHLORIDE 20 MEQ PO PACK
40.0000 meq | PACK | ORAL | Status: AC
  Administered 2021-12-06 (×2): 40 meq
  Filled 2021-12-06 (×2): qty 2

## 2021-12-06 MED ORDER — LACTATED RINGERS IV SOLN
INTRAVENOUS | Status: DC
Start: 2021-12-06 — End: 2021-12-06

## 2021-12-06 NOTE — Progress Notes (Signed)
Neurology Progress Note ? ?Patient ID: Katie Olson is a 26 y.o. female, spanish speaking, BMI 39.87, recent cholecystectomy (3/28) due to symptomatic cholelithiasis, presented with fever, n/v, AMS, LP, EEG and MRI reassuring  ? ? ?Major interval events/Subjective: ?- Extubated ?- Rash on hands developed overnight, small blisters which are pruritic and painful, with a couple lesions on her face and on her feet but predominantly on the palms of the hands ? ?As patient is now extubated, I was able to obtain significant additional history.  She and her husband both confirm that the patient never had any confusion, but she felt quite unwell.  Her symptoms started with a headache but this was her typical headache that she gets 1-2 times a week, which last about 30 minutes and resolves with Tylenol and is associated with some sound sensitivity but not light sensitivity, no nausea or vomiting with the headache typically.  She then began to have fever and vomiting, followed by weakness, followed by numbness/tingling in all 4 of her distal extremities, followed by stiffening all over which she recalls and during which she did not lose consciousness or awareness. ? ?Exam: ?Vitals:  ? 12/06/21 0700 12/06/21 0800  ?BP: 115/68   ?Pulse: 82   ?Resp: 19   ?Temp:  99.1 ?F (37.3 ?C)  ?SpO2: 99%   ? ?Gen: In bed, comfortable  ?Resp: non-labored breathing, no grossly audible wheezing ?Cardiac: Perfusing extremities well  ?Abd: soft, nt ? ?Neuro: ?MS: Awake, alert, oriented, able to follow commands, name and repeat ?CN: Visual fields full to confrontation, pupils equal round reactive to light, face symmetric, sensation symmetric on her face, tongue midline, shoulder shrug symmetric ?Motor: 5/5 strength throughout ?Sensory: Intact to light touch throughout ?DTR: 2+ and symmetric in the biceps and brachioradialis and patellae ? ?Pertinent data: ? ?EEG within normal limits ?MRI brain personally reviewed, normal scan with and  without contrast ? ? ?Impression: I suspect that the patient's of stiffening episode was rigors in the setting of her high fever, certainly MRI and EEG are very reassuring.  There was no focality to her examination described previously by history or in the chart or appreciated on any of my examinations.  Overall I have low concern for any meningitis/encephalitis at this time, although early on an HSV infection CSF studies can be normal.  Her rash does not look like a typical HSV rash to me, but I defer evaluation of this to primary team and/or ID. ? ?Recommendations: ?-Continue acyclovir until HSV 1/2 results negative or may discontinue earlier per infectious disease ?-No indication for initiation of antiseizure medications ?-No further neurological work-up indicated at this time, neurology will be available on an as-needed basis, please reach out if any questions or concerns arise ? ?Brooke Dare MD-PhD ?[facility] ?[phone]  ?[facility] coverage for [facility] is from 8 AM to 4 AM in-house and 4 PM to 8 PM by telephone/video. 8 PM to 8 AM emergent questions or overnight urgent questions should be addressed to Teleneurology On-call or [facility] neurohospitalist; contact information can be found on AMION ? ? ?Greater than 50 minutes were spent in care of this patient today, greater than 50% of bedside obtaining additional history and discussing work-up and plan as detailed above, with assistance of a Spanish interpreter by video ? ?

## 2021-12-06 NOTE — Progress Notes (Signed)
?PROGRESS NOTE ? ? ? ?Katie Olson  [id number] DOB: Nov 19, 1995 DOA: 12/05/2021 ?PCP: Pcp, No  ?106A/106A-AA ? ? ?Assessment & Plan: ?  ?Principal Problem: ?  Severe sepsis ([place]) ? ? ? ?Katie Olson is a 26 y.o.  female with hx of recent cholecystectomy (3/28) due to symptomatic cholelithiasis who presented to [facility] ED on 12/05/2021 due to complaints of high fevers, headache, vomiting, and altered mental status. ? ?Due to concerns for severe sepsis in the setting of suspected meningitis versus encephalitis, pt was intubated in the ED for airway protection and to facilitate work-up and treatment.  Pt was given IV fluids and broad-spectrum antibiotics and IV Acyclovir.  Blood and urine cultures were obtained, and LP was performed by ED provider.  Neuro was consulted.  LP, EEG and MRI reassuring.  Pt was extubated later the same day and transferred to hospitalist service on 5/12. ?  ?# Severe sepsis 2/2 ?# Viral illness ?--fever, tachycardia, leukocytosis, elevated lactic acid.  Likely source viral infection. ?--bacterial meningitis ruled out.  Imaging neg for PNA or intra-abdominal infection.  MRI no signs of encephalitis.  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 ?PLAN:  ?--ID consult today ?--ok to d/c IV Acyclovir, per ID ?--f/u HSV and RVP ? ?# Acute metabolic encephalopathy, resolved ?--2/2 high fever and viral illness ? ?# headache  ?# Hx of migraine ?--per pt, she gets headache 1-2 times a week.  Headache resolved. ? ?# Obesity, BMI 39.87 ? ? ?DVT prophylaxis: Lovenox SQ ?Code Status: Full code  ?Family Communication: husband updated at bedside ?Level of care: Med-Surg ?Dispo:   ?The patient is from: home ?Anticipated d/c is to: home ?Anticipated d/c date is: tomorrow ? ? ?Subjective and Interval History:  ?iPAD translator used. ? ?Pt reported feeling better today, no more N/V and abdominal pain.  No diarrhea.  Reported she had sore throat PTA.    ? ?This morning, pt noted development of round rash on her palms and small blisters on her hands.   ? ? ?Objective: ?Vitals:  ? 12/06/21 1000 12/06/21 1100 12/06/21 1200 12/06/21 1656  ?BP: 115/73 116/71 115/69 108/71  ?Pulse: 73 84 82 94  ?Resp: 20  (!) 22 16  ?Temp:   98.4 ?F (36.9 ?C) 98.5 ?F (36.9 ?C)  ?TempSrc:   Oral Oral  ?SpO2: 100% 99% 100% 100%  ?Weight:      ?Height:      ? ? ?Intake/Output Summary (Last 24 hours) at 12/06/2021 1833 ?Last data filed at 12/06/2021 1100 ?Gross per 24 hour  ?Intake 1764.72 ml  ?Output 1100 ml  ?Net 664.72 ml  ? ?Filed Weights  ? 12/05/21 0545  ?Weight: 98.9 kg  ? ? ?Examination:  ? ?Constitutional: NAD, AAOx3 ?HEENT: conjunctivae and lids normal, EOMI.  Erythema and a few petechiae seen at the back of the throat.  ?CV: No cyanosis.   ?RESP: normal respiratory effort, on RA ?Extremities: No effusions, edema in BLE ?SKIN: warm, dry.   ?Neuro: II - XII grossly intact.   ?Psych: Normal mood and affect.  Appropriate judgement and reason ? ? ? ? ? ? ? ?Data Reviewed: I have personally reviewed following labs and imaging studies ? ?CBC: ?Recent Labs  ?Lab 12/05/21 ?[id number] 12/06/21 ?0418  ?WBC 14.7* 5.8  ?HGB 10.9* 9.4*  ?HCT 35.4* 31.1*  ?MCV 69.7* 71.2*  ?PLT 400 335  ? ?Basic Metabolic Panel: ?Recent Labs  ?Lab 12/05/21 ?[id number] 12/06/21 ?0418  ?  NA 137 141  ?K 3.5 3.0*  ?CL 107 112*  ?CO2 22 25  ?GLUCOSE 110* 95  ?BUN 8 <5*  ?CREATININE 0.74 0.35*  ?CALCIUM 8.6* 8.1*  ?MG 1.8  --   ? ?GFR: ?Estimated Creatinine Clearance: 119.1 mL/min (A) (by C-G formula based on SCr of 0.35 mg/dL (L)). ?Liver Function Tests: ?Recent Labs  ?Lab 12/05/21 ?[id number]  ?AST 32  ?ALT 20  ?ALKPHOS 82  ?BILITOT 0.6  ?PROT 7.9  ?ALBUMIN 3.8  ? ?Recent Labs  ?Lab 12/05/21 ?[id number]  ?LIPASE 27  ? ?No results for input(s): AMMONIA in the last 168 hours. ?Coagulation Profile: ?Recent Labs  ?Lab 12/05/21 ?[id number]  ?INR 1.2  ? ?Cardiac Enzymes: ?No results for input(s): CKTOTAL, CKMB, CKMBINDEX, TROPONINI in the last 168  hours. ?BNP (last 3 results) ?No results for input(s): PROBNP in the last 8760 hours. ?HbA1C: ?Recent Labs  ?  12/05/21 ?[id number]  ?HGBA1C 5.3  ? ?CBG: ?Recent Labs  ?Lab 12/05/21 ?0934  ?GLUCAP 94  ? ?Lipid Profile: ?No results for input(s): CHOL, HDL, LDLCALC, TRIG, CHOLHDL, LDLDIRECT in the last 72 hours. ?Thyroid Function Tests: ?No results for input(s): TSH, T4TOTAL, FREET4, T3FREE, THYROIDAB in the last 72 hours. ?Anemia Panel: ?No results for input(s): VITAMINB12, FOLATE, FERRITIN, TIBC, IRON, RETICCTPCT in the last 72 hours. ?Sepsis Labs: ?Recent Labs  ?Lab 12/05/21 ?[id number] 12/05/21 ?0715 12/06/21 ?[id number]  ?PROCALCITON <0.10  --  0.15  ?LATICACIDVEN 3.2* 1.0  --   ? ? ?Recent Results (from the past 240 hour(s))  ?Blood Culture (routine x 2)     Status: None (Preliminary result)  ? Collection Time: 12/05/21  5:04 AM  ? Specimen: BLOOD  ?Result Value Ref Range Status  ? Specimen Description BLOOD RIGHT HAND  Final  ? Special Requests   Final  ?  BOTTLES DRAWN AEROBIC AND ANAEROBIC Blood Culture adequate volume  ? Culture   Final  ?  NO GROWTH < 24 HOURS ?Performed at [facility], [address], [place], [place] [address] ?  ? Report Status PENDING  Incomplete  ?Blood Culture (routine x 2)     Status: None (Preliminary result)  ? Collection Time: 12/05/21  5:04 AM  ? Specimen: BLOOD  ?Result Value Ref Range Status  ? Specimen Description BLOOD LEFT AC  Final  ? Special Requests   Final  ?  BOTTLES DRAWN AEROBIC AND ANAEROBIC Blood Culture adequate volume  ? Culture   Final  ?  NO GROWTH 1 DAY ?Performed at [facility], [address] [place], [place] [address] ?  ? Report Status PENDING  Incomplete  ?Urine Culture     Status: None  ? Collection Time: 12/05/21  5:04 AM  ? Specimen: In/Out Cath Urine  ?Result Value Ref Range Status  ? Specimen Description   Final  ?  IN/OUT CATH URINE ?Performed at [facility], [address], [place], [place] [address] ?  ? Special Requests   Final  ?   NONE ?Performed at [facility], [address], [place], [place] [address] ?  ? Culture   Final  ?  NO GROWTH ?Performed at [facility], [place] [address], [place], [place] [address] ?  ? Report Status 12/06/2021 FINAL  Final  ?Resp Panel by RT-PCR (Flu A&B, Covid) Nasopharyngeal Swab     Status: None  ? Collection Time: 12/05/21  5:04 AM  ? Specimen: Nasopharyngeal Swab; Nasopharyngeal(NP) swabs in vial transport medium  ?Result Value Ref  Range Status  ? SARS Coronavirus 2 by RT PCR NEGATIVE NEGATIVE Final  ?  Comment: (NOTE) ?SARS-CoV-2 target nucleic acids are NOT DETECTED. ? ?The SARS-CoV-2 RNA is generally detectable in upper respiratory ?specimens during the acute phase of infection. The lowest ?concentration of SARS-CoV-2 viral copies this assay can detect is ?138 copies/mL. A negative result does not preclude SARS-Cov-2 ?infection and should not be used as the sole basis for treatment or ?other patient management decisions. A negative result may occur with  ?improper specimen collection/handling, submission of specimen other ?than nasopharyngeal swab, presence of viral mutation(s) within the ?areas targeted by this assay, and inadequate number of viral ?copies(<138 copies/mL). A negative result must be combined with ?clinical observations, patient history, and epidemiological ?information. The expected result is Negative. ? ?Fact Sheet for Patients:  ?EntrepreneurPulse.com.au ? ?Fact Sheet for Healthcare Providers:  ?IncredibleEmployment.be ? ?This test is no t yet approved or cleared by the [place] FDA and  ?has been authorized for detection and/or diagnosis of SARS-CoV-2 by ?FDA under an Emergency Use Authorization (EUA). This EUA will remain  ?in effect (meaning this test can be used) for the duration of the ?COVID-19 declaration under Section 564(b)(1) of the Act, 21 ?U.S.C.section 360bbb-3(b)(1), unless the authorization is terminated  ?or revoked  sooner.  ? ? ?  ? Influenza A by PCR NEGATIVE NEGATIVE Final  ? Influenza B by PCR NEGATIVE NEGATIVE Final  ?  Comment: (NOTE) ?The Xpert Xpress SARS-CoV-2/FLU/RSV plus assay is intended as an aid ?in the

## 2021-12-06 NOTE — Consult Note (Signed)
NAME: Katie Olson  ?DOB: 04-09-1996  ?MRN: [id number]  ?Date/Time: 12/06/2021 2:29 PM ? ?REQUESTING PROVIDER: Dr. Fran Lowes ?Subjective:  ?REASON FOR CONSULT: Fever with blisters arms questioning hand-foot-and-mouth disease ?History obtained through Spanish interpreter service.  Spouse at bedside ?Katie Olson is a 26 y.o. female with with a recent history of lap cholecystectomy for gallstones on 10/02/2021 presents with fever of 1 day duration. ?She took Tylenol and then TheraFlu.  Because the fever was worsening and she was having headache, vomiting she came to the ED on 12/05/2021 at 4 AM. ?In the ED vitals of 08[birth year]28/1981, temperature 103.2, heart rate 123 respiratory rate 33.  Patient was hyperventilating and with anxiety and she had spasms of her hands and feet.  She was very anxious and because work-up could not be done they are to sedate and intubate her.  Labs revealed WBC of 14.7, Hb 10.9, platelet 400 and creatinine 0.74.  She underwent a lumbar puncture.  And was started on vancomycin, ceftriaxone and acyclovir.  She was admitted to the ICU ?CSF revealed 5 WBC, protein of 13 and glucose of 62 essentially normal.  She got extubated today. ?The nurse noted that she was having some papular lesions on her arms as well as some vesicular lesions.  She also has sores on her mouth.  This was not present yesterday. ?A patient lives with her 3 children and her husband.  None of them have been sick. ?The children do not go to daycare center ?Patient herself did not have any cough or shortness of breath or runny nose. ?She now has pain in her throat which she attributes to  the intubation ? ?Past medical history ?Gallstones ? ?Past surgical history neck lap cholecystectomy ?  ?Social History  ? ?Socioeconomic History  ? Marital status: Single  ?  Spouse name: FOB= Ashley Jacobs  ? Number of children: 2  ? Years of education: 52  ? Highest education level: 9th grade  ?Occupational History  ? Occupation: Unemployed  ?Tobacco  Use  ? Smoking status: Former  ?  Types: Cigarettes  ?  Quit date: 08/10/2020  ?  Years since quitting: 1.3  ? Smokeless tobacco: Never  ? Tobacco comments:  ?  used to smoke 4 singles /day; Passive Expousre- parttner smokes  ?Vaping Use  ? Vaping Use: Never used  ?Substance and Sexual Activity  ? Alcohol use: Never  ? Drug use: Never  ? Sexual activity: Not Currently  ?  Birth control/protection: None  ?Other Topics Concern  ? Not on file  ?Social History Narrative  ? Not on file  ? ?Social Determinants of Health  ? ?Financial Resource Strain: Not on file  ?Food Insecurity: Not on file  ?Transportation Needs: Not on file  ?Physical Activity: Not on file  ?Stress: Not on file  ?Social Connections: Not on file  ?Intimate Partner Violence: Not At Risk  ? Fear of Current or Ex-Partner: No  ? Emotionally Abused: No  ? Physically Abused: No  ? Sexually Abused: No  ?  ?Family History  ?Problem Relation Age of Onset  ? Multiple births Sister   ?     twins, "not identical"  ? Diabetes Maternal Grandmother   ? Diabetes Maternal Grandfather   ? ?No Known Allergies ?I? ?Current Facility-Administered Medications  ?Medication Dose Route Frequency Provider Last Rate Last Admin  ? acyclovir (ZOVIRAX) 695 mg in dextrose 5 % 100 mL IVPB  10 mg/kg (Adjusted) Intravenous Q8H Delton Prairie, MD 113.9 mL/hr  at 12/06/21 1425 695 mg at 12/06/21 1425  ? Chlorhexidine Gluconate Cloth 2 % PADS 6 each  6 each Topical Q0600 Martina Sinner, MD   6 each at 12/05/21 0945  ? docusate (COLACE) 50 MG/5ML liquid 100 mg  100 mg Per Tube BID Harlon Ditty D, NP      ? docusate sodium (COLACE) capsule 100 mg  100 mg Oral BID PRN Judithe Modest, NP      ? enoxaparin (LOVENOX) injection 50 mg  0.5 mg/kg Subcutaneous Q24H Harlon Ditty D, NP   50 mg at 12/05/21 2212  ? fentaNYL (SUBLIMAZE) bolus via infusion 50-100 mcg  50-100 mcg Intravenous Q15 min PRN Harlon Ditty D, NP      ? fentaNYL in NS (64mcg/ml) infusion-PREMIX  50-200  mcg/hr Intravenous Continuous Judithe Modest, NP   Paused at 12/05/21 1030  ? lactated ringers infusion   Intravenous Continuous Martyn Malay, RPH 50 mL/hr at 12/06/21 1100 Infusion Verify at 12/06/21 1100  ? midazolam (VERSED) injection 2 mg  2 mg Intravenous Q15 min PRN Judithe Modest, NP      ? midazolam (VERSED) injection 2 mg  2 mg Intravenous Q2H PRN Judithe Modest, NP   2 mg at 12/05/21 3664  ? ondansetron (ZOFRAN) injection 4 mg  4 mg Intravenous Q6H PRN Judithe Modest, NP      ? polyethylene glycol (MIRALAX / GLYCOLAX) packet 17 g  17 g Per Tube Daily Harlon Ditty D, NP      ? polyethylene glycol (MIRALAX / GLYCOLAX) packet 17 g  17 g Oral Daily PRN Harlon Ditty D, NP      ? propofol (DIPRIVAN) 1000 MG/100ML infusion  5-80 mcg/kg/min Intravenous Continuous Delton Prairie, MD   Stopped at 12/05/21 1200  ?  ? ?Abtx:  ?Anti-infectives (From admission, onward)  ? ? Start     Dose/Rate Route Frequency Ordered Stop  ? 12/05/21 1730  cefTRIAXone (ROCEPHIN) 2 g in sodium chloride 0.9 % 100 mL IVPB  Status:  Discontinued       ? 2 g ?200 mL/hr over 30 Minutes Intravenous Every 12 hours 12/05/21 0808 12/05/21 0933  ? 12/05/21 0700  acyclovir (ZOVIRAX) 1,045 mg in dextrose 5 % 250 mL IVPB  Status:  Discontinued       ? 15 mg/kg ? 69.6 kg (Adjusted) ?270.9 mL/hr over 60 Minutes Intravenous  Once 12/05/21 0624 12/05/21 0633  ? 12/05/21 0645  acyclovir (ZOVIRAX) 695 mg in dextrose 5 % 100 mL IVPB       ? 10 mg/kg ? 69.6 kg (Adjusted) ?113.9 mL/hr over 60 Minutes Intravenous Every 8 hours 12/05/21 0633    ? 12/05/21 0630  acyclovir (ZOVIRAX) 1,045 mg in dextrose 5 % 250 mL IVPB  Status:  Discontinued       ? 15 mg/kg ? 69.6 kg (Adjusted) ?270.9 mL/hr over 60 Minutes Intravenous  Once 12/05/21 0621 12/05/21 0624  ? 12/05/21 0530  cefTRIAXone (ROCEPHIN) 2 g in sodium chloride 0.9 % 100 mL IVPB       ? 2 g ?200 mL/hr over 30 Minutes Intravenous  Once 12/05/21 0520 12/05/21 0620  ? 12/05/21 0530   vancomycin (VANCOREADY) IVPB 2000 mg/400 mL       ? 2,000 mg ?200 mL/hr over 120 Minutes Intravenous  Once 12/05/21 0520 12/05/21 1144  ? ?  ? ? ?REVIEW OF SYSTEMS:  ?Const:  fever, negative chills, negative weight loss ?Eyes: negative diplopia  or visual changes, negative eye pain ?ENT: negative coryza, negative sore throat ?Resp: negative cough, hemoptysis, dyspnea ?Cards: negative for chest pain, palpitations, lower extremity edema ?GU: negative for frequency, dysuria and hematuria ?GI: Had nausea and vomiting negative for abdominal pain, diarrhea, bleeding, constipation ?Skin: Now has pruritic rash ?Heme: negative for easy bruising and gum/nose bleeding ?MS: Body aches and muscle weakness Neurolo:negative for headaches, dizziness, vertigo, memory problems  ?Psych: negative for feelings of anxiety, depression  ?Endocrine: negative for thyroid, diabetes ?Allergy/Immunology- negative for any medication or food allergies ?? ?Objective:  ?VITALS:  ?BP 115/69 (BP Location: Right Arm)   Pulse 82   Temp 98.4 ?F (36.9 ?C) (Oral)   Resp (!) 22   Ht 5\' 2"  (1.575 m)   Wt 98.9 kg   SpO2 100%   BMI 39.87 kg/m?  ?LDA ?None  ?PHYSICAL EXAM:  ?General: Alert, cooperative, no distress, appears stated age.  ?Head: Normocephalic, without obvious abnormality, atraumatic. ?Eyes: Conjunctivae clear, anicteric sclerae. Pupils are equal ?ENT Nares normal. No drainage or sinus tenderness. ?Enlarged tonsils ?No pustular lesions ?Has some erythematous spots over the roof of the mouth ?Neck: Supple, symmetrical, no adenopathy, thyroid: non tender ?no carotid bruit and no JVD. ?Back: No CVA tenderness. ?Lungs: Clear to auscultation bilaterally. No Wheezing or Rhonchi. No rales. ?Heart: Regular rate and rhythm, no murmur, rub or gallop. ?Abdomen: Soft, non-tender,not distended. Bowel sounds normal. No masses ?Extremities: atraumatic, no cyanosis. No edema. No clubbing ?Skin: Has vesicles over and her fingers  ? ? ? ? ? ? ? ? ?lymph:  Cervical, supraclavicular normal. ?Neurologic: Grossly non-focal ?Pertinent Labs ?Lab Results ?CBC ?   ?Component Value Date/Time  ? WBC 5.8 12/06/2021 0418  ? RBC 4.37 12/06/2021 0418  ? HGB 9.4 (L) 12/06/2021 0418  ? HGB

## 2021-12-06 NOTE — Consult Note (Signed)
PHARMACY CONSULT NOTE - FOLLOW UP ? ?Pharmacy Consult for Electrolyte Monitoring and Replacement  ? ?Recent Labs: ?Potassium (mmol/L)  ?Date Value  ?12/06/2021 3.0 (L)  ? ?Magnesium (mg/dL)  ?Date Value  ?12/05/2021 1.8  ? ?Calcium (mg/dL)  ?Date Value  ?12/06/2021 8.1 (L)  ? ?Albumin (g/dL)  ?Date Value  ?12/05/2021 3.8  ?11/02/2020 4.3  ? ?Sodium (mmol/L)  ?Date Value  ?12/06/2021 141  ?11/02/2020 137  ? ? ? ?Assessment: ?26yo female w/ recent cholecystecomy 10/22/2021 but otherwise no significant PMH, presenting with acute metabolic encephalopathy and severe sepsis ISO suspected meningitis vs encephalitis. Admitted to CCU initially requiring intubation for airway support 5/11 AM, extubated 5/11 PM. Started treatment for viral meningitis and pharmacy consulted for mgmt of electrolyes. ? ?MIVF: LR @50ml /h (renal protection during acyclovir Tx) ? ?Goal of Therapy:  ?Lytes WNL ? ?Plan:  ?K: 3 - will give Per tube q2h x2 doses ?Mg: 1.8 - will give MgSO IV 2g x1 dose ?CTM daily with AM labs and replace PRN ? ? ,PharmD ?Clinical Pharmacist ?12/06/2021 8:36 AM ? ?

## 2021-12-07 DIAGNOSIS — A419 Sepsis, unspecified organism: Secondary | ICD-10-CM | POA: Diagnosis not present

## 2021-12-07 DIAGNOSIS — R6520 Severe sepsis without septic shock: Secondary | ICD-10-CM | POA: Diagnosis not present

## 2021-12-07 LAB — BASIC METABOLIC PANEL
Anion gap: 4 — ABNORMAL LOW (ref 5–15)
BUN: 7 mg/dL (ref 6–20)
CO2: 24 mmol/L (ref 22–32)
Calcium: 8.4 mg/dL — ABNORMAL LOW (ref 8.9–10.3)
Chloride: 111 mmol/L (ref 98–111)
Creatinine, Ser: 0.46 mg/dL (ref 0.44–1.00)
GFR, Estimated: >60 mL/min (ref >60)
Glucose, Bld: 86 mg/dL (ref 70–99)
Potassium: 3.6 mmol/L (ref 3.5–5.1)
Sodium: 139 mmol/L (ref 135–145)

## 2021-12-07 LAB — CBC
HCT: 34.8 % — ABNORMAL LOW (ref 36.0–46.0)
Hemoglobin: 10.6 g/dL — ABNORMAL LOW (ref 12.0–15.0)
MCH: 21.7 pg — ABNORMAL LOW (ref 26.0–34.0)
MCHC: 30.5 g/dL (ref 30.0–36.0)
MCV: 71.2 fL — ABNORMAL LOW (ref 80.0–100.0)
Platelets: 402 10*3/uL — ABNORMAL HIGH (ref 150–400)
RBC: 4.89 MIL/uL (ref 3.87–5.11)
RDW: 18.6 % — ABNORMAL HIGH (ref 11.5–15.5)
WBC: 6.1 10*3/uL (ref 4.0–10.5)
nRBC: 0 % (ref 0.0–0.2)

## 2021-12-07 LAB — PHOSPHORUS: Phosphorus: 3.5 mg/dL (ref 2.5–4.6)

## 2021-12-07 LAB — MAGNESIUM: Magnesium: 2.4 mg/dL (ref 1.7–2.4)

## 2021-12-07 MED ORDER — HYDROXYZINE HCL 50 MG PO TABS
25.0000 mg | ORAL_TABLET | Freq: Four times a day (QID) | ORAL | Status: DC | PRN
  Administered 2021-12-07: 25 mg via ORAL

## 2021-12-07 NOTE — Consult Note (Signed)
PHARMACY CONSULT NOTE - FOLLOW UP ? ?Pharmacy Consult for Electrolyte Monitoring and Replacement  ? ?Recent Labs: ?Potassium (mmol/L)  ?Date Value  ?12/07/2021 3.6  ? ?Magnesium (mg/dL)  ?Date Value  ?12/07/2021 2.4  ? ?Calcium (mg/dL)  ?Date Value  ?12/07/2021 8.4 (L)  ? ?Albumin (g/dL)  ?Date Value  ?12/05/2021 3.8  ?11/02/2020 4.3  ? ?Phosphorus (mg/dL)  ?Date Value  ?12/07/2021 3.5  ? ?Sodium (mmol/L)  ?Date Value  ?12/07/2021 139  ?11/02/2020 137  ? ? ? ?Assessment: ?26yo female w/ recent cholecystecomy 10/22/2021 but otherwise no significant PMH, presenting with acute metabolic encephalopathy and severe sepsis ISO suspected meningitis vs encephalitis. Admitted to CCU initially requiring intubation for airway support 5/11 AM, extubated 5/11 PM. Started treatment for viral meningitis and pharmacy consulted for mgmt of electrolyes. ?-viral tx d/c ? ?Goal of Therapy:  ?Lytes WNL ? ?Plan:  ?No replacement at this time ?CTM daily with AM labs and replace PRN ? ?Noralee Space ,PharmD ?Clinical Pharmacist ?12/07/2021 9:38 AM ? ?

## 2021-12-07 NOTE — Discharge Summary (Signed)
Physician Discharge Summary   Katie Olson  female DOB: 02/16/1996  WUJ:[id number]  PCP: Pcp, No  Admit date: 12/05/2021 Discharge date: 12/07/2021  Admitted From: home Disposition:  home CODE STATUS: Full code   Hospital Course:  For full details, please see H&P, progress notes, consult notes and ancillary notes.  Briefly,  Katie Olson is a 26 y.o.  female with hx of recent cholecystectomy (3/28) due to symptomatic cholelithiasis who presented to [facility] ED on 12/05/2021 due to complaints of high fevers, headache, vomiting, and altered mental status.   Due to concerns for severe sepsis in the setting of suspected meningitis versus encephalitis, pt was intubated in the ED for airway protection and to facilitate work-up and treatment.  Pt was given IV fluids and broad-spectrum antibiotics and IV Acyclovir.  Blood and urine cultures were obtained, and LP was performed by ED provider.  Neuro was consulted.  LP, EEG and MRI reassuring.  Pt was extubated later the same day and transferred to hospitalist service on 5/12.   # Severe sepsis 2/2 # Coxsackievirus infection --fever, tachycardia, leukocytosis, elevated lactic acid.  Likely source viral infection. --bacterial meningitis ruled out.  Imaging neg for PNA or intra-abdominal infection.  MRI no signs of encephalitis.  --developed rash in palms and blisters on hands the next day, and with sore throat, suggesting hand foot and mouth disease, likely Coxsackievirus, later confirmed by RVP returning pos for Enterovirus.   # Acute metabolic encephalopathy, resolved --2/2 high fever and viral illness   # headache  # Hx of migraine --per pt, she gets headache 1-2 times a week.  Headache resolved.   # Obesity, BMI 39.87   Discharge Diagnoses:  Principal Problem:   Severe sepsis (HCC)   30 Day Unplanned Readmission Risk Score    Flowsheet Row ED to Hosp-Admission (Current) from 12/05/2021 in [facility]  1C MEDICAL TELEMETRY  30 Day Unplanned Readmission Risk Score (%) 10.1 Filed at 12/07/2021 0400       This score is the patient's risk of an unplanned readmission within 30 days of being discharged (0 -100%). The score is based on dignosis, age, lab data, medications, orders, and past utilization.   Low:  0-14.9   Medium: 15-21.9   High: 22-29.9   Extreme: 30 and above         Discharge Instructions:  Allergies as of 12/07/2021   No Known Allergies      Medication List     TAKE these medications    acetaminophen 500 MG tablet Commonly known as: TYLENOL Take 2 tablets (1,000 mg total) by mouth every 6 (six) hours as needed for mild pain.          No Known Allergies   The results of significant diagnostics from this hospitalization (including imaging, microbiology, ancillary and laboratory) are listed below for reference.   Consultations:   Procedures/Studies: CT ABDOMEN PELVIS WO CONTRAST  Result Date: 12/05/2021 CLINICAL DATA:  Sepsis with worsening fever. EXAM: CT ABDOMEN AND PELVIS WITHOUT CONTRAST TECHNIQUE: Multidetector CT imaging of the abdomen and pelvis was performed following the standard protocol without IV contrast. RADIATION DOSE REDUCTION: This exam was performed according to the departmental dose-optimization program which includes automated exposure control, adjustment of the mA and/or kV according to patient size and/or use of iterative reconstruction technique. COMPARISON:  None Available. FINDINGS: Lower chest: Dependent atelectasis noted both lung bases. Hepatobiliary: No suspicious focal abnormality within the liver parenchyma. Gallbladder surgically absent. No  intrahepatic or extrahepatic biliary dilation. Pancreas: No focal mass lesion. No dilatation of the main duct. No intraparenchymal cyst. No peripancreatic edema. Spleen: No splenomegaly. No focal mass lesion. Adrenals/Urinary Tract: No adrenal nodule or mass. Kidneys unremarkable. No evidence  for hydroureter. Small gas bubble in the bladder lumen presumably secondary to recent instrumentation. Stomach/Bowel: NG tube tip is in the distal stomach. Duodenum is normally positioned as is the ligament of Treitz. No small bowel wall thickening. No small bowel dilatation. The terminal ileum is normal. The appendix is normal. No gross colonic mass. No colonic wall thickening. Vascular/Lymphatic: No abdominal aortic aneurysm. There is no gastrohepatic or hepatoduodenal ligament lymphadenopathy. No retroperitoneal or mesenteric lymphadenopathy. No pelvic sidewall lymphadenopathy. Reproductive: The uterus is unremarkable.  There is no adnexal mass. Other: No intraperitoneal free fluid. Musculoskeletal: No worrisome lytic or sclerotic osseous abnormality. IMPRESSION: 1. No acute findings in the abdomen or pelvis. Specifically, no findings to explain the patient's history of sepsis with fever. 2. NG tube tip is in the distal stomach. 3. Small gas bubble in the bladder lumen presumably secondary to recent instrumentation. In the absence of recent instrumentation, infection would be a consideration. CT Electronically Signed   By: Kennith CenterEric  Mansell M.D.   On: 12/05/2021 11:48   CT HEAD WO CONTRAST (5MM)  Result Date: 12/05/2021 CLINICAL DATA:  Fever, headache and septic. Concern for encephalitis/meningitis. EXAM: CT HEAD WITHOUT CONTRAST TECHNIQUE: Contiguous axial images were obtained from the base of the skull through the vertex without intravenous contrast. RADIATION DOSE REDUCTION: This exam was performed according to the departmental dose-optimization program which includes automated exposure control, adjustment of the mA and/or kV according to patient size and/or use of iterative reconstruction technique. COMPARISON:  None Available. FINDINGS: Brain: No evidence of acute infarction, hemorrhage, hydrocephalus, extra-axial collection or mass lesion/mass effect. Vascular: No hyperdense vessel or unexpected  calcification. Skull: Normal. Negative for fracture or focal lesion. Sinuses/Orbits: Mastoid air cells and paranasal sinuses are clear. Other: None IMPRESSION: No acute intracranial abnormalities. Electronically Signed   By: Signa Kellaylor  Stroud M.D.   On: 12/05/2021 06:34   MR BRAIN W WO CONTRAST  Result Date: 12/05/2021 CLINICAL DATA:  Altered mental status, concern for CNS infection/meningitis EXAM: MRI HEAD WITHOUT AND WITH CONTRAST TECHNIQUE: Multiplanar, multiecho pulse sequences of the brain and surrounding structures were obtained without and with intravenous contrast. CONTRAST:  7.575mL GADAVIST GADOBUTROL 1 MMOL/ML IV SOLN COMPARISON:  No prior MRI, correlation is made with CT head 12/05/2021 FINDINGS: Brain: No restricted diffusion to suggest acute or subacute infarct. No acute hemorrhage, mass, mass effect, or midline shift. No abnormal parenchymal or meningeal enhancement. No hydrocephalus or extra-axial collection. Partial empty sella. Normal craniocervical junction. Vascular: Normal flow voids. Skull and upper cervical spine: Normal marrow signal. Sinuses/Orbits: No acute finding. Other: The mastoids are well aerated. IMPRESSION: No acute intracranial process. Electronically Signed   By: Wiliam KeAlison  Vasan M.D.   On: 12/05/2021 11:33   DG Chest Portable 1 View  Result Date: 12/05/2021 CLINICAL DATA:  Orogastric tube placement EXAM: PORTABLE ABDOMEN - 1 VIEW; PORTABLE CHEST - 1 VIEW COMPARISON:  Earlier the same day FINDINGS: Endotracheal tube with tip between the clavicular heads and carina, 11 mm above the carina. The enteric tube tip reaches the distal stomach. Low volume chest with atelectasis or infiltrate greater on the left. Artifact from EKG leads. Normal heart size. Normal visible bowel gas pattern. IMPRESSION: 1. Enteric tube with tip at the distal stomach. 2. Endotracheal tube with tip  11 mm above the carina. 3. Lower volume chest with atelectasis or infiltrate greater on the left. Electronically  Signed   By: Tiburcio Pea M.D.   On: 12/05/2021 07:45   DG Chest Port 1 View  Result Date: 12/05/2021 CLINICAL DATA:  Concern for sepsis.  Evaluate for abnormality. EXAM: PORTABLE CHEST 1 VIEW COMPARISON:  None Available. FINDINGS: The heart size and mediastinal contours are within normal limits. Both lungs are clear. The visualized skeletal structures are unremarkable. IMPRESSION: No active disease. Electronically Signed   By: Signa Kell M.D.   On: 12/05/2021 06:03   DG Abd Portable 1 View  Result Date: 12/05/2021 CLINICAL DATA:  Orogastric tube placement EXAM: PORTABLE ABDOMEN - 1 VIEW; PORTABLE CHEST - 1 VIEW COMPARISON:  Earlier the same day FINDINGS: Endotracheal tube with tip between the clavicular heads and carina, 11 mm above the carina. The enteric tube tip reaches the distal stomach. Low volume chest with atelectasis or infiltrate greater on the left. Artifact from EKG leads. Normal heart size. Normal visible bowel gas pattern. IMPRESSION: 1. Enteric tube with tip at the distal stomach. 2. Endotracheal tube with tip 11 mm above the carina. 3. Lower volume chest with atelectasis or infiltrate greater on the left. Electronically Signed   By: Tiburcio Pea M.D.   On: 12/05/2021 07:45   EEG adult  Result Date: 12/05/2021 Charlsie Quest, MD     12/05/2021  2:09 PM Patient Name: Romey Cohea MRN: [id number] Epilepsy Attending: Charlsie Quest Referring Physician/Provider: Gordy Councilman, MD Date: 12/05/2021 Duration: 30.43 mins Patient history:26yo F patient presenting with fever and altered mental status. EEG to evaluate for seizure Level of alertness: Awake, asleep AEDs during EEG study: None Technical aspects: This EEG study was done with scalp electrodes positioned according to the 10-20 International system of electrode placement. Electrical activity was acquired at a sampling rate of  and reviewed with a high frequency filter of  and a low frequency filter of .  EEG data were recorded continuously and digitally stored. Description: The posterior dominant rhythm consists of 9 Hz activity of moderate voltage (25-35 uV) seen predominantly in posterior head regions, symmetric and reactive to eye opening and eye closing. Sleep was characterized by vertex waves, sleep spindles (12 to 14 Hz), maximal frontocentral region. Hyperventilation and photic stimulation were not performed.   IMPRESSION: This study is within normal limits. No seizures or epileptiform discharges were seen throughout the recording. Priyanka O Yadav      Labs: BNP (last 3 results) No results for input(s): BNP in the last 8760 hours. Basic Metabolic Panel: Recent Labs  Lab 12/05/21 0504 12/06/21 0418 12/07/21 0613  NA 137 141 139  K 3.5 3.0* 3.6  CL 107 112* 111  CO2 GLUCOSE 110* 95 86  BUN 8 <5* 7  CREATININE 0.74 0.35* 0.46  CALCIUM 8.6* 8.1* 8.4*  MG 1.8  --  2.4  PHOS  --   --  3.5   Liver Function Tests: Recent Labs  Lab 12/05/21 0504  AST 32  ALT 20  ALKPHOS 82  BILITOT 0.6  PROT 7.9  ALBUMIN 3.8   Recent Labs  Lab 12/05/21 0504  LIPASE 27   No results for input(s): AMMONIA in the last 168 hours. CBC: Recent Labs  Lab 12/05/21 0504 12/06/21 0418 12/07/21 0613  WBC 14.7* 5.8 6.1  HGB 10.9* 9.4* 10.6*  HCT 35.4* 31.1* 34.8*  MCV 69.7* 71.2* 71.2*  PLT  400 335 402*   Cardiac Enzymes: No results for input(s): CKTOTAL, CKMB, CKMBINDEX, TROPONINI in the last 168 hours. BNP: Invalid input(s): POCBNP CBG: Recent Labs  Lab 12/05/21 0934  GLUCAP 94   D-Dimer No results for input(s): DDIMER in the last 72 hours. Hgb A1c Recent Labs    12/05/21 0504  HGBA1C 5.3   Lipid Profile No results for input(s): CHOL, HDL, LDLCALC, TRIG, CHOLHDL, LDLDIRECT in the last 72 hours. Thyroid function studies No results for input(s): TSH, T4TOTAL, T3FREE, THYROIDAB in the last 72 hours.  Invalid input(s): FREET3 Anemia work up No results for  input(s): VITAMINB12, FOLATE, FERRITIN, TIBC, IRON, RETICCTPCT in the last 72 hours. Urinalysis    Component Value Date/Time   COLORURINE YELLOW (A) 12/05/2021 0504   APPEARANCEUR CLEAR (A) 12/05/2021 0504   APPEARANCEUR Clear 03/15/2021 1152   LABSPEC 1.019 12/05/2021 0504   PHURINE 9.0 (H) 12/05/2021 0504   GLUCOSEU NEGATIVE 12/05/2021 0504   HGBUR NEGATIVE 12/05/2021 0504   BILIRUBINUR NEGATIVE 12/05/2021 0504   BILIRUBINUR Negative 03/15/2021 1152   KETONESUR 20 (A) 12/05/2021 0504   PROTEINUR 30 (A) 12/05/2021 0504   UROBILINOGEN 2.0 (H) 02/19/2015 1458   NITRITE NEGATIVE 12/05/2021 0504   LEUKOCYTESUR NEGATIVE 12/05/2021 0504   Sepsis Labs Invalid input(s): PROCALCITONIN,  WBC,  LACTICIDVEN Microbiology Recent Results (from the past 240 hour(s))  Blood Culture (routine x 2)     Status: None (Preliminary result)   Collection Time: 12/05/21  5:04 AM   Specimen: BLOOD  Result Value Ref Range Status   Specimen Description BLOOD RIGHT HAND  Final   Special Requests   Final    BOTTLES DRAWN AEROBIC AND ANAEROBIC Blood Culture adequate volume   Culture   Final    NO GROWTH 2 DAYS Performed at [facility], [address], [place], [place] [address]    Report Status PENDING  Incomplete  Blood Culture (routine x 2)     Status: None (Preliminary result)   Collection Time: 12/05/21  5:04 AM   Specimen: BLOOD  Result Value Ref Range Status   Specimen Description BLOOD LEFT [facility]  Final   Special Requests   Final    BOTTLES DRAWN AEROBIC AND ANAEROBIC Blood Culture adequate volume   Culture   Final    NO GROWTH 2 DAYS Performed at [facility], [address], [place], [place] [address]    Report Status PENDING  Incomplete  Urine Culture     Status: None   Collection Time: 12/05/21  5:04 AM   Specimen: In/Out Cath Urine  Result Value Ref Range Status   Specimen Description   Final    IN/OUT CATH URINE Performed at [facility], [address], [place], [place] [address]    Special Requests   Final    NONE Performed at [facility], [address], [place], [place] [address]    Culture   Final    NO GROWTH Performed at [facility], [address], [place], [place] [address]    Report Status 12/06/2021 FINAL  Final  Resp Panel by RT-PCR (Flu A&B, Covid) Nasopharyngeal Swab     Status: None   Collection Time: 12/05/21  5:04 AM   Specimen: Nasopharyngeal Swab; Nasopharyngeal(NP) swabs in vial transport medium  Result Value Ref Range Status   SARS Coronavirus 2 by RT PCR NEGATIVE NEGATIVE Final    Comment: (NOTE) SARS-CoV-2 target nucleic acids are NOT DETECTED.  The SARS-CoV-2 RNA  is generally detectable in upper respiratory specimens during the acute phase of infection. The lowest concentration of SARS-CoV-2 viral copies this assay can detect is 138 copies/mL. A negative result does not preclude SARS-Cov-2 infection and should not be used as the sole basis for treatment or other patient management decisions. A negative result may occur with  improper specimen collection/handling, submission of specimen other than nasopharyngeal swab, presence of viral mutation(s) within the areas targeted by this assay, and inadequate number of viral copies(<138 copies/mL). A negative result must be combined with clinical observations, patient history, and epidemiological information. The expected result is Negative.  Fact Sheet for Patients:  BloggerCourse.com  Fact Sheet for Healthcare Providers:  SeriousBroker.it  This test is no t yet approved or cleared by the [place] FDA and  has been authorized for detection and/or diagnosis of SARS-CoV-2 by FDA under an Emergency Use Authorization (EUA). This EUA will remain  in effect (meaning this test can be used) for the duration of the COVID-19 declaration under Section 564(b)(1) of the Act, 21 U.S.C.section  360bbb-3(b)(1), unless the authorization is terminated  or revoked sooner.       Influenza A by PCR NEGATIVE NEGATIVE Final   Influenza B by PCR NEGATIVE NEGATIVE Final    Comment: (NOTE) The Xpert Xpress SARS-CoV-2/FLU/RSV plus assay is intended as an aid in the diagnosis of influenza from Nasopharyngeal swab specimens and should not be used as a sole basis for treatment. Nasal washings and aspirates are unacceptable for Xpert Xpress SARS-CoV-2/FLU/RSV testing.  Fact Sheet for Patients: BloggerCourse.com  Fact Sheet for Healthcare Providers: SeriousBroker.it  This test is not yet approved or cleared by the [place] FDA and has been authorized for detection and/or diagnosis of SARS-CoV-2 by FDA under an Emergency Use Authorization (EUA). This EUA will remain in effect (meaning this test can be used) for the duration of the COVID-19 declaration under Section 564(b)(1) of the Act, 21 U.S.C. section 360bbb-3(b)(1), unless the authorization is terminated or revoked.  Performed at [facility], [address] [place], [place] [address]   CSF culture w Gram Stain     Status: None (Preliminary result)   Collection Time: 12/05/21  6:50 AM   Specimen: CSF; Cerebrospinal Fluid  Result Value Ref Range Status   Specimen Description   Final    CSF Performed at [facility], [address], [place], [place] [address]    Special Requests   Final    NONE Performed at [facility], [address] [place], [place] [address]    Gram Stain WBC SEEN NO RBC SEEN NO ORGANISMS SEEN   Final   Culture   Final    NO GROWTH 1 DAY Performed at [facility], [address] [place], [place] [address]    Report Status PENDING  Incomplete  MRSA Next Gen by PCR, Nasal     Status: None   Collection Time: 12/05/21  9:37 AM   Specimen: Nasal Mucosa; Nasal Swab  Result Value Ref Range Status   MRSA by PCR  Next Gen NOT DETECTED NOT DETECTED Final    Comment: (NOTE) The GeneXpert MRSA Assay (FDA approved for NASAL specimens only), is one component of a comprehensive MRSA colonization surveillance program. It is not intended to diagnose MRSA infection nor to guide or monitor treatment for MRSA infections. Test performance is not FDA approved in patients less than 40 years old. Performed at [facility], [address] [place],  [place] [address]   Chlamydia/NGC rt PCR ([facility] only)     Status: None   Collection Time: 12/06/21  2:07 PM   Specimen: Throat  Result Value Ref Range Status   Specimen source GC/Chlam ENDOCERVICAL  Final   Chlamydia Tr NOT DETECTED NOT DETECTED Final   N gonorrhoeae NOT DETECTED NOT DETECTED Final    Comment: (NOTE) This CT/NG assay has not been evaluated in patients with a history of  hysterectomy. Performed at [facility], [address] [place], [place] [address]   Respiratory (~20 pathogens) panel by PCR     Status: Abnormal   Collection Time: 12/06/21  3:41 PM   Specimen: Nasopharyngeal Swab; Respiratory  Result Value Ref Range Status   Adenovirus NOT DETECTED NOT DETECTED Final   Coronavirus 229E NOT DETECTED NOT DETECTED Final    Comment: (NOTE) The Coronavirus on the Respiratory Panel, DOES NOT test for the novel  Coronavirus (2019 nCoV)    Coronavirus HKU1 NOT DETECTED NOT DETECTED Final   Coronavirus NL63 NOT DETECTED NOT DETECTED Final   Coronavirus OC43 NOT DETECTED NOT DETECTED Final   Metapneumovirus NOT DETECTED NOT DETECTED Final   Rhinovirus / Enterovirus DETECTED (A) NOT DETECTED Final   Influenza A NOT DETECTED NOT DETECTED Final   Influenza B NOT DETECTED NOT DETECTED Final   Parainfluenza Virus 1 NOT DETECTED NOT DETECTED Final   Parainfluenza Virus 2 NOT DETECTED NOT DETECTED Final   Parainfluenza Virus 3 NOT DETECTED NOT DETECTED Final   Parainfluenza Virus 4 NOT DETECTED NOT DETECTED Final   Respiratory  Syncytial Virus NOT DETECTED NOT DETECTED Final   Bordetella pertussis NOT DETECTED NOT DETECTED Final   Bordetella Parapertussis NOT DETECTED NOT DETECTED Final   Chlamydophila pneumoniae NOT DETECTED NOT DETECTED Final   Mycoplasma pneumoniae NOT DETECTED NOT DETECTED Final    Comment: Performed at [facility], [address], [place], [place] [address]  Group A Strep by PCR     Status: None   Collection Time: 12/06/21  4:13 PM   Specimen: Throat; Sterile Swab  Result Value Ref Range Status   Group A Strep by PCR NOT DETECTED NOT DETECTED Final    Comment: Performed at [facility], [address] [place], [place] [address]     Total time spend on discharging this patient, including the last patient exam, discussing the hospital stay, instructions for ongoing care as it relates to all pertinent caregivers, as well as preparing the medical discharge records, prescriptions, and/or referrals as applicable, is 35 minutes.    Darlin Priestly, MD  [facility] 12/07/2021, 7:45 AM

## 2021-12-08 LAB — VZV PCR, CSF: VZV PCR, CSF: NEGATIVE

## 2021-12-08 LAB — CSF CULTURE W GRAM STAIN: Culture: NO GROWTH

## 2021-12-08 LAB — HSV 1/2 PCR, CSF
HSV-1 DNA: NEGATIVE
HSV-2 DNA: NEGATIVE

## 2021-12-12 LAB — CULTURE, BLOOD (ROUTINE X 2)
Culture: NO GROWTH
Culture: NO GROWTH
Special Requests: ADEQUATE
Special Requests: ADEQUATE

## 2021-12-16 LAB — VIRUS CULTURE

## 2022-01-07 ENCOUNTER — Ambulatory Visit (LOCAL_COMMUNITY_HEALTH_CENTER): Payer: Medicaid Other | Admitting: Family Medicine

## 2022-01-07 ENCOUNTER — Encounter: Payer: Self-pay | Admitting: Family Medicine

## 2022-01-07 VITALS — BP 104/68 | Ht 62.2 in | Wt 213.6 lb

## 2022-01-07 DIAGNOSIS — Z309 Encounter for contraceptive management, unspecified: Secondary | ICD-10-CM

## 2022-01-07 DIAGNOSIS — N938 Other specified abnormal uterine and vaginal bleeding: Secondary | ICD-10-CM

## 2022-01-07 DIAGNOSIS — Z3200 Encounter for pregnancy test, result unknown: Secondary | ICD-10-CM

## 2022-01-07 DIAGNOSIS — Z30011 Encounter for initial prescription of contraceptive pills: Secondary | ICD-10-CM

## 2022-01-07 DIAGNOSIS — Z3009 Encounter for other general counseling and advice on contraception: Secondary | ICD-10-CM

## 2022-01-07 DIAGNOSIS — Z3202 Encounter for pregnancy test, result negative: Secondary | ICD-10-CM

## 2022-01-07 LAB — HM HIV SCREENING LAB: HM HIV Screening: NEGATIVE

## 2022-01-07 LAB — PREGNANCY, URINE: Preg Test, Ur: NEGATIVE

## 2022-01-07 MED ORDER — NORGESTIMATE-ETH ESTRADIOL 0.25-35 MG-MCG PO TABS
1.0000 | ORAL_TABLET | Freq: Every day | ORAL | 4 refills | Status: DC
Start: 2022-01-07 — End: 2022-08-13

## 2022-01-07 NOTE — Progress Notes (Signed)
Patient seen for PE , STD check and for [facility]. Patient will pick up OCP at pharmacy.

## 2022-01-07 NOTE — Progress Notes (Signed)
[facility] Number: [phone]  Family Planning Visit- Repeat Yearly Visit  Subjective:  Katie Olson is a 26 y.o. G3P3003  being seen today for an annual wellness visit and to discuss contraception options.   The patient is currently using No Method - Other Reason for pregnancy prevention. Patient does not know want a pregnancy in the next year.   she/her/hers report they are looking for a method that provides Cycle control   Patient has the following medical problems: has Language barrier; Supervision of high-risk pregnancy, unspecified trimester; Symptomatic cholelithiasis; and Severe sepsis ([place]) on their problem list.  Chief Complaint  Patient presents with   Annual Exam    Patient reports she is here for her annual PE, PT and evaluation of abnormal periods since birth of baby 04/2021.  States that she has had 1-2 periods monthly since 04/2021.  Client states that she had a GB surgery 09/2021 and admitted to hospital in 11/2021 for evaluation of meningitis- eventually she was diagnosed with hand/mouth/foot dz.  She and partner are not using BCM and is not sure if she wants another pregnancy at this time.  She would like to take medication to regulate her periods and the current bleeding problem.  LMP ast sex was 01/02/2022 without a condom.  Patient denies other concerns.  See flowsheet for other program required questions.   Body mass index is 38.82 kg/m. - Patient is eligible for diabetes screening based on BMI and age [id number]?  not applicable [id number] ordered? not applicable  Patient reports 1 of partners in last year. Desires STI screening?  Yes HIV only. Had negative screening for GC and HIV 12/06/2021 at [facility].  Decline GC/chlamydia   Has patient been screened once for HCV in the past?  No  No results found for: "HCVAB"  Does the patient have current of drug use, have a partner with drug use,  and/or has been incarcerated since last result? No  If yes-- Screen for HCV through [facility]   Does the patient meet criteria for HBV testing? No  Criteria:  -Household, sexual or needle sharing contact with HBV -History of drug use -HIV positive -Those with known Hep C   Health Maintenance Due  Topic Date Due   HPV VACCINES (1 - 2-dose series) Never done    Review of Systems  Constitutional:        Weight gain- 20lbs. After delivery.  Neurological:  Positive for headaches.       Headaches- without aura, Tylenol helps to relieve.    The following portions of the patient's history were reviewed and updated as appropriate: allergies, current medications, past family history, past medical history, past social history, past surgical history and problem list. Problem list updated.  Objective:   Vitals:   01/07/22 1012  BP: 104/68  Weight: 213 lb 9.6 oz (96.9 kg)  Height: 5' 2.2" (1.58 m)    Physical Exam Constitutional:      Appearance: Normal appearance.  HENT:     Head: Normocephalic and atraumatic.  Pulmonary:     Effort: Pulmonary effort is normal.  Abdominal:     Palpations: Abdomen is soft.  Genitourinary:    Comments: Client deferred pelvic/testing- stated was done 11/2021 when [facility].  Musculoskeletal:        General: Normal range of motion.     Cervical back: Neck supple.  Skin:    General: Skin  is warm and dry.  Neurological:     General: No focal deficit present.     Mental Status: She is alert.  Psychiatric:        Mood and Affect: Mood normal.        Behavior: Behavior normal.      Assessment and Plan:  Katie Olson is a 26 y.o. female [phone] presenting to the [facility] Department for an yearly wellness and contraception visit   Contraception counseling: Reviewed options based on patient desire and reproductive life plan. Patient is interested in Oral Contraceptive. This was provided to the patient  today.  Risks, benefits, and typical effectiveness rates were reviewed.  Questions were answered.  Written information was also given to the patient to review.    The patient will follow up in  2 months for DUB surveillance.  The patient was told to call with any further questions, or with any concerns about this method of contraception.  Emphasized use of condoms 100% of the time for STI prevention.  Patient was assessed for need for ECP. Patient was offered ECP based on Unprotected sex within past 72 hours.  Patient is within 5 days of unprotected sex. Client declined ECPs.   1. Encounter for pregnancy test, result unknown  - Pregnancy, urine- negative  2. Family planning  - HIV [place] [facility]  3. OCP (oral contraceptive pills) initiation  - norgestimate-ethinyl estradiol (ORTHO-CYCLEN) 0.25-35 MG-MCG tablet; Take 1 tablet by mouth daily.  Dispense: 84 tablet; Refill: 4  4. DUB Take the Orthocyclen 35 mg pills for extended cycle.  Co client to take 12 weeks of active pills and 13th week 1 week of inactive pills.  X 1 year.  Client states she understands the instructions.  Interpretor Line [phone]   Return for DUB.- 2 months.  No future appointments.  Hassell Done, FNP

## 2022-04-02 ENCOUNTER — Emergency Department
Admission: EM | Admit: 2022-04-02 | Discharge: 2022-04-02 | Disposition: A | Discharge status: Home / Self Care | Payer: Medicaid Other | Attending: Emergency Medicine | Admitting: Emergency Medicine

## 2022-04-02 ENCOUNTER — Other Ambulatory Visit: Payer: Self-pay

## 2022-04-02 ENCOUNTER — Encounter: Payer: Self-pay | Admitting: Emergency Medicine

## 2022-04-02 DIAGNOSIS — N3090 Cystitis, unspecified without hematuria: Secondary | ICD-10-CM | POA: Diagnosis not present

## 2022-04-02 DIAGNOSIS — R519 Headache, unspecified: Secondary | ICD-10-CM | POA: Diagnosis not present

## 2022-04-02 DIAGNOSIS — K2900 Acute gastritis without bleeding: Secondary | ICD-10-CM | POA: Diagnosis not present

## 2022-04-02 DIAGNOSIS — R1012 Left upper quadrant pain: Secondary | ICD-10-CM | POA: Diagnosis present

## 2022-04-02 LAB — CBC
HCT: 36.8 % (ref 36.0–46.0)
Hemoglobin: 11.3 g/dL — ABNORMAL LOW (ref 12.0–15.0)
MCH: 22.2 pg — ABNORMAL LOW (ref 26.0–34.0)
MCHC: 30.7 g/dL (ref 30.0–36.0)
MCV: 72.3 fL — ABNORMAL LOW (ref 80.0–100.0)
Platelets: 487 10*3/uL — ABNORMAL HIGH (ref 150–400)
RBC: 5.09 MIL/uL (ref 3.87–5.11)
RDW: 18 % — ABNORMAL HIGH (ref 11.5–15.5)
WBC: 12.7 10*3/uL — ABNORMAL HIGH (ref 4.0–10.5)
nRBC: 0 % (ref 0.0–0.2)

## 2022-04-02 LAB — COMPREHENSIVE METABOLIC PANEL
ALT: 22 U/L (ref 0–44)
AST: 22 U/L (ref 15–41)
Albumin: 3.9 g/dL (ref 3.5–5.0)
Alkaline Phosphatase: 89 U/L (ref 38–126)
Anion gap: 7 (ref 5–15)
BUN: 13 mg/dL (ref 6–20)
CO2: 23 mmol/L (ref 22–32)
Calcium: 8.9 mg/dL (ref 8.9–10.3)
Chloride: 106 mmol/L (ref 98–111)
Creatinine, Ser: 0.54 mg/dL (ref 0.44–1.00)
GFR, Estimated: >60 mL/min (ref >60)
Glucose, Bld: 104 mg/dL — ABNORMAL HIGH (ref 70–99)
Potassium: 3.8 mmol/L (ref 3.5–5.1)
Sodium: 136 mmol/L (ref 135–145)
Total Bilirubin: 0.6 mg/dL (ref 0.3–1.2)
Total Protein: 8.1 g/dL (ref 6.5–8.1)

## 2022-04-02 LAB — URINALYSIS, ROUTINE W REFLEX MICROSCOPIC
Bilirubin Urine: NEGATIVE
Glucose, UA: NEGATIVE mg/dL
Hgb urine dipstick: NEGATIVE
Ketones, ur: NEGATIVE mg/dL
Nitrite: NEGATIVE
Protein, ur: NEGATIVE mg/dL
Specific Gravity, Urine: 1.019 (ref 1.005–1.030)
WBC, UA: >50 WBC/hpf — ABNORMAL HIGH (ref 0–5)
pH: 7 (ref 5.0–8.0)

## 2022-04-02 LAB — LIPASE, BLOOD: Lipase: 30 U/L (ref 11–51)

## 2022-04-02 LAB — POC URINE PREG, ED: Preg Test, Ur: NEGATIVE

## 2022-04-02 MED ORDER — NITROFURANTOIN MACROCRYSTAL 100 MG PO CAPS: 100.0000 mg | ORAL_CAPSULE | Freq: Two times a day (BID) | ORAL | 0 refills | Status: DC

## 2022-04-02 MED ORDER — FAMOTIDINE 20 MG PO TABS: 20.0000 mg | ORAL_TABLET | Freq: Two times a day (BID) | ORAL | 0 refills | Status: DC

## 2022-04-02 MED ORDER — METOCLOPRAMIDE HCL 10 MG PO TABS: 10.0000 mg | ORAL_TABLET | Freq: Four times a day (QID) | ORAL | 0 refills | Status: DC | PRN

## 2022-04-02 MED ORDER — FAMOTIDINE 20 MG PO TABS
40.0000 mg | ORAL_TABLET | Freq: Once | ORAL | Status: AC
  Administered 2022-04-02: 40 mg via ORAL
  Filled 2022-04-02: qty 2

## 2022-04-02 MED ORDER — METOCLOPRAMIDE HCL 10 MG PO TABS
10.0000 mg | ORAL_TABLET | Freq: Once | ORAL | Status: AC
  Administered 2022-04-02: 10 mg via ORAL
  Filled 2022-04-02: qty 1

## 2022-04-02 MED ORDER — IBUPROFEN 600 MG PO TABS
600.0000 mg | ORAL_TABLET | Freq: Once | ORAL | Status: AC
  Administered 2022-04-02: 600 mg via ORAL
  Filled 2022-04-02: qty 1

## 2022-04-02 MED ORDER — ALUM & MAG HYDROXIDE-SIMETH 200-200-20 MG/5ML PO SUSP
30.0000 mL | Freq: Once | ORAL | Status: AC
  Administered 2022-04-02: 30 mL via ORAL
  Filled 2022-04-02: qty 30

## 2022-04-02 NOTE — ED Triage Notes (Signed)
Pt to ED via POV for abdominal pain, vomiting and headache since this morning around 0600. Pt is in NAD.

## 2022-04-02 NOTE — ED Notes (Signed)
Pt A&O, pt given discharge instructions, pt ambulating with steady gait. 

## 2022-04-02 NOTE — ED Provider Notes (Signed)
[facility] Provider Note    Event Date/Time   First MD Initiated Contact with Patient 04/02/22 1059     (approximate)   History   Chief Complaint: Emesis and Headache   HPI  Katie Olson is a 26 y.o. female  with no significant pmh, s/p cholecystectomy, who comes to the ED c/o LUQ abd pain that starting this morning around 6am.  Assoc. With vomiting, generalized headache. No fever/chills.  No diarrhea or constipation.  No chest pain or shortness of breath.  No unusual vaginal bleeding or discharge.     Physical Exam   Triage Vital Signs: ED Triage Vitals  Enc Vitals Group     BP 04/02/22 1003 113/66     Pulse Rate 04/02/22 1003 80     Resp 04/02/22 1003 16     Temp 04/02/22 1003 98.3 F (36.8 C)     Temp Source 04/02/22 1003 Oral     SpO2 04/02/22 1003 96 %     Weight --      Height --      Head Circumference --      Peak Flow --      Pain Score 04/02/22 1004 7     Pain Loc --      Pain Edu? --      Excl. in GC? --     Most recent vital signs: Vitals:   04/02/22 1003  BP: 113/66  Pulse: 80  Resp: 16  Temp: 98.3 F (36.8 C)  SpO2: 96%    General: Awake, no distress.  CV:  Good peripheral perfusion.  Regular rate and rhythm Resp:  Normal effort.  Abd:  No distention.  Soft with mild left upper quadrant tenderness Other:  No rash.  Moist oral mucosa.   ED Results / Procedures / Treatments   Labs (all labs ordered are listed, but only abnormal results are displayed) Labs Reviewed  COMPREHENSIVE METABOLIC PANEL - Abnormal; Notable for the following components:      Result Value   Glucose, Bld 104 (*)    All other components within normal limits  CBC - Abnormal; Notable for the following components:   WBC 12.7 (*)    Hemoglobin 11.3 (*)    MCV 72.3 (*)    MCH 22.2 (*)    RDW 18.0 (*)    Platelets 487 (*)    All other components within normal limits  URINALYSIS, ROUTINE W REFLEX MICROSCOPIC - Abnormal; Notable for  the following components:   Color, Urine YELLOW (*)    APPearance CLOUDY (*)    Leukocytes,Ua LARGE (*)    WBC, UA >50 (*)    Bacteria, UA RARE (*)    All other components within normal limits  LIPASE, BLOOD  POC URINE PREG, ED     EKG    RADIOLOGY    PROCEDURES:  Procedures   MEDICATIONS ORDERED IN ED: Medications  alum & mag hydroxide-simeth (MAALOX/MYLANTA) 200-200-20 MG/5ML suspension 30 mL (30 mLs Oral Given 04/02/22 1137)  famotidine (PEPCID) tablet 40 mg (40 mg Oral Given 04/02/22 1137)  metoCLOPramide (REGLAN) tablet 10 mg (10 mg Oral Given 04/02/22 1137)  ibuprofen (ADVIL) tablet 600 mg (600 mg Oral Given 04/02/22 1207)     IMPRESSION / MDM / ASSESSMENT AND PLAN / ED COURSE  I reviewed the triage vital signs and the nursing notes.  Differential diagnosis includes, but is not limited to, gastritis, pancreatitis, cystitis, viral illness, dehydration  Patient's presentation is most consistent with acute presentation with potential threat to life or bodily function.  Patient presents with abdominal pain and vomiting.  Low suspicion for biliary disease, appendicitis, diverticulitis, bowel obstruction, bowel perforation, diverticulitis.  She is not pregnant.  Doubt STI PID TOA or torsion.  Serum labs are normal.  Urinalysis consistent with UTI.  Tolerating p.o. after GI cocktail/antacids.  Will start Macrobid, continue antacids, follow-up with primary care.       FINAL CLINICAL IMPRESSION(S) / ED DIAGNOSES   Final diagnoses:  Cystitis  Acute gastritis without hemorrhage, unspecified gastritis type     Rx / DC Orders   ED Discharge Orders          Ordered    nitrofurantoin (MACRODANTIN) 100 MG capsule  2 times daily        04/02/22 1240    famotidine (PEPCID) 20 MG tablet  2 times daily        04/02/22 1240    metoCLOPramide (REGLAN) 10 MG tablet  Every 6 hours PRN        04/02/22 1240             Note:  This document  was prepared using Dragon voice recognition software and may include unintentional dictation errors.   Sharman Cheek, MD 04/02/22 1242

## 2022-05-21 LAB — HSV DNA BY PCR (REFERENCE LAB)
HSV 1 DNA: NEGATIVE
HSV 2 DNA: NEGATIVE

## 2022-06-10 ENCOUNTER — Ambulatory Visit (LOCAL_COMMUNITY_HEALTH_CENTER): Payer: Medicaid Other

## 2022-06-10 VITALS — BP 117/59 | Ht 62.21 in | Wt 216.5 lb

## 2022-06-10 DIAGNOSIS — Z3201 Encounter for pregnancy test, result positive: Secondary | ICD-10-CM

## 2022-06-10 LAB — PREGNANCY, URINE: Preg Test, Ur: POSITIVE — AB

## 2022-06-10 MED ORDER — PRENATAL 27-0.8 MG PO TABS: 1.0000 | ORAL_TABLET | Freq: Every day | ORAL | 0 refills | Status: DC

## 2022-06-10 NOTE — Progress Notes (Signed)
UPT positive. Plans prenatal care at [facility]. Lang line [phone].   The patient was dispensed prenatal vitamins #100 today. I provided counseling today regarding the medication. We discussed the medication, the side effects and when to call clinic. Patient given the opportunity to ask questions. Questions answered.   Sent to clerk for preadmit. Jerel Shepherd, RN

## 2022-06-23 ENCOUNTER — Encounter: Payer: Self-pay | Admitting: Emergency Medicine

## 2022-06-23 DIAGNOSIS — R519 Headache, unspecified: Secondary | ICD-10-CM | POA: Insufficient documentation

## 2022-06-23 DIAGNOSIS — Z3A10 10 weeks gestation of pregnancy: Secondary | ICD-10-CM | POA: Diagnosis not present

## 2022-06-23 DIAGNOSIS — R1013 Epigastric pain: Secondary | ICD-10-CM | POA: Insufficient documentation

## 2022-06-23 DIAGNOSIS — O0289 Other abnormal products of conception: Secondary | ICD-10-CM | POA: Insufficient documentation

## 2022-06-23 DIAGNOSIS — O26891 Other specified pregnancy related conditions, first trimester: Secondary | ICD-10-CM | POA: Diagnosis present

## 2022-06-23 LAB — URINALYSIS, ROUTINE W REFLEX MICROSCOPIC
Bacteria, UA: NONE SEEN
Bilirubin Urine: NEGATIVE
Glucose, UA: NEGATIVE mg/dL
Hgb urine dipstick: NEGATIVE
Ketones, ur: 5 mg/dL — AB
Nitrite: NEGATIVE
Protein, ur: NEGATIVE mg/dL
Specific Gravity, Urine: 1.019 (ref 1.005–1.030)
pH: 6 (ref 5.0–8.0)

## 2022-06-23 LAB — POC URINE PREG, ED: Preg Test, Ur: POSITIVE — AB

## 2022-06-23 NOTE — ED Notes (Signed)
Poct Pregnancy Positive

## 2022-06-23 NOTE — ED Triage Notes (Signed)
Pt presents via POV with complaints of headache, nausea, and epigastric pain especially after food for the last month. Pt is [redacted] weeks pregnant. Pt has been treated the headache with tylenol, last dose taken around 1600. Denies vaginal bleeding, discharge, CP or SOB.

## 2022-06-23 NOTE — ED Provider Triage Note (Signed)
Emergency Medicine Provider Triage Evaluation Note  Katie Olson , a 26 y.o. female  was evaluated in triage.  Pt complains of epigastric abdominal pain after eating, nausea and headache.  Patient thinks that she is approximately [redacted] weeks pregnant.  No vaginal bleeding or pelvic pain.  No chest pain, chest tightness, shortness of breath or fever.  Review of Systems  Positive: Patient has epigastric abdominal pain, headache and nausea.  Negative: No chest pain, chest tightness or SOB.   Physical Exam  LMP 04/14/2022  Gen:   Awake, no distress   Resp:  Normal effort  MSK:   Moves extremities without difficulty  Other:    Medical Decision Making  Medically screening exam initiated at 8:08 PM.  Appropriate orders placed.  Carmesha Ladavia Lindenbaum was informed that the remainder of the evaluation will be completed by another provider, this initial triage assessment does not replace that evaluation, and the importance of remaining in the ED until their evaluation is complete.     [name], [place] 06/23/22 2009

## 2022-06-24 ENCOUNTER — Emergency Department
Admission: EM | Admit: 2022-06-24 | Discharge: 2022-06-24 | Disposition: A | Discharge status: Home / Self Care | Payer: Medicaid Other | Attending: Emergency Medicine | Admitting: Emergency Medicine

## 2022-06-24 DIAGNOSIS — R519 Headache, unspecified: Secondary | ICD-10-CM

## 2022-06-24 DIAGNOSIS — R1013 Epigastric pain: Secondary | ICD-10-CM

## 2022-06-24 LAB — CBC WITH DIFFERENTIAL/PLATELET
Abs Immature Granulocytes: 0.03 10*3/uL (ref 0.00–0.07)
Basophils Absolute: 0 10*3/uL (ref 0.0–0.1)
Basophils Relative: 0 %
Eosinophils Absolute: 0 10*3/uL (ref 0.0–0.5)
Eosinophils Relative: 0 %
HCT: 36.2 % (ref 36.0–46.0)
Hemoglobin: 11.7 g/dL — ABNORMAL LOW (ref 12.0–15.0)
Immature Granulocytes: 0 %
Lymphocytes Relative: 26 %
Lymphs Abs: 2.5 10*3/uL (ref 0.7–4.0)
MCH: 23.4 pg — ABNORMAL LOW (ref 26.0–34.0)
MCHC: 32.3 g/dL (ref 30.0–36.0)
MCV: 72.4 fL — ABNORMAL LOW (ref 80.0–100.0)
Monocytes Absolute: 0.6 10*3/uL (ref 0.1–1.0)
Monocytes Relative: 6 %
Neutro Abs: 6.3 10*3/uL (ref 1.7–7.7)
Neutrophils Relative %: 68 %
Platelets: 388 10*3/uL (ref 150–400)
RBC: 5 MIL/uL (ref 3.87–5.11)
RDW: 16.4 % — ABNORMAL HIGH (ref 11.5–15.5)
WBC: 9.5 10*3/uL (ref 4.0–10.5)
nRBC: 0 % (ref 0.0–0.2)

## 2022-06-24 LAB — COMPREHENSIVE METABOLIC PANEL
ALT: 21 U/L (ref 0–44)
AST: 18 U/L (ref 15–41)
Albumin: 3.6 g/dL (ref 3.5–5.0)
Alkaline Phosphatase: 64 U/L (ref 38–126)
Anion gap: 7 (ref 5–15)
BUN: 6 mg/dL (ref 6–20)
CO2: 23 mmol/L (ref 22–32)
Calcium: 9.3 mg/dL (ref 8.9–10.3)
Chloride: 106 mmol/L (ref 98–111)
Creatinine, Ser: 0.42 mg/dL — ABNORMAL LOW (ref 0.44–1.00)
GFR, Estimated: >60 mL/min (ref >60)
Glucose, Bld: 93 mg/dL (ref 70–99)
Potassium: 3.3 mmol/L — ABNORMAL LOW (ref 3.5–5.1)
Sodium: 136 mmol/L (ref 135–145)
Total Bilirubin: 0.4 mg/dL (ref 0.3–1.2)
Total Protein: 7.8 g/dL (ref 6.5–8.1)

## 2022-06-24 LAB — LIPASE, BLOOD: Lipase: 34 U/L (ref 11–51)

## 2022-06-24 LAB — HCG, QUANTITATIVE, PREGNANCY: hCG, Beta Chain, Quant, S: 62150 m[IU]/mL — ABNORMAL HIGH (ref <5)

## 2022-06-24 MED ORDER — ACETAMINOPHEN 500 MG PO TABS
1000.0000 mg | ORAL_TABLET | Freq: Once | ORAL | Status: AC
  Administered 2022-06-24: 1000 mg via ORAL
  Filled 2022-06-24: qty 2

## 2022-06-24 MED ORDER — METOCLOPRAMIDE HCL 10 MG PO TABS
10.0000 mg | ORAL_TABLET | Freq: Once | ORAL | Status: AC
  Administered 2022-06-24: 10 mg via ORAL
  Filled 2022-06-24: qty 1

## 2022-06-24 MED ORDER — ALUM & MAG HYDROXIDE-SIMETH 200-200-20 MG/5ML PO SUSP
30.0000 mL | Freq: Once | ORAL | Status: AC
  Administered 2022-06-24: 30 mL via ORAL
  Filled 2022-06-24: qty 30

## 2022-06-24 MED ORDER — VITAMIN B-6 25 MG PO TABS: 25.0000 mg | ORAL_TABLET | Freq: Three times a day (TID) | ORAL | 0 refills | Status: AC | PRN

## 2022-06-24 MED ORDER — FAMOTIDINE 20 MG PO TABS
20.0000 mg | ORAL_TABLET | Freq: Once | ORAL | Status: AC
Start: 2022-06-24 — End: 2022-06-24
  Administered 2022-06-24: 20 mg via ORAL
  Filled 2022-06-24: qty 1

## 2022-06-24 MED ORDER — PANTOPRAZOLE SODIUM 40 MG PO TBEC: 40.0000 mg | DELAYED_RELEASE_TABLET | Freq: Every day | ORAL | 0 refills | Status: DC

## 2022-06-24 NOTE — ED Notes (Signed)
Patient discharged to home per MD order. Patient in stable condition, and deemed medically cleared by ED provider for discharge. Discharge instructions reviewed with patient/family using "Teach Back"; verbalized understanding of medication education and administration, and information about follow-up care. Denies further concerns. ° °

## 2022-06-24 NOTE — ED Provider Notes (Signed)
[facility] Provider Note    Event Date/Time   First MD Initiated Contact with Patient 06/24/22 0136     (approximate)   History   Headache and Abdominal Pain   HPI  Katie Olson is a 26 y.o. female G4, P3 who is approximately [redacted] weeks pregnant who presents with headache and abdominal pain.  Patient is about [redacted] weeks pregnant but has not had an ultrasound yet.  She has had about a month of epigastric abdominal pain that is rather constant but is worse when she is vomiting and eating.  She vomits about 2-3 times per day but is tolerating p.o. denies diarrhea.  Pain is sharp.  Has had a cholecystectomy.  Denies urinary symptoms vaginal bleeding fevers or chills.  Patient has not taken anything of the Tylenol for the pain.  Patient also endorses about a month of headache it is on the top of her head and it is daily.  No significant alleviating or exacerbating factors denies neck pain fevers chills numbness tingling weakness or visual change.  Patient does not have history of migraine headache, although I see in her history that she does have history of migraines and during an admission this past year when she was admitted for meningitis she noted that she gets headache 1-2 times per week.     Past Medical History:  Diagnosis Date   Patient denies medical problems     Patient Active Problem List   Diagnosis Date Noted   Severe sepsis ([facility]) 12/05/2021   Symptomatic cholelithiasis    Supervision of high-risk pregnancy, unspecified trimester 10/04/2020   Language barrier 10/10/2014     Physical Exam  Triage Vital Signs: ED Triage Vitals  Enc Vitals Group     BP 06/23/22 2009 104/75     Pulse Rate 06/23/22 2009 91     Resp 06/23/22 2009 18     Temp 06/23/22 2009 98.4 F (36.9 C)     Temp src --      SpO2 06/23/22 2009 99 %     Weight 06/23/22 2008 219 lb 9.3 oz (99.6 kg)     Height 06/23/22 2008 5' 2.21" (1.58 m)     Head Circumference --       Peak Flow --      Pain Score --      Pain Loc --      Pain Edu? --      Excl. in GC? --     Most recent vital signs: Vitals:   06/23/22 2009  BP: 104/75  Pulse: 91  Resp: 18  Temp: 98.4 F (36.9 C)  SpO2: 99%     General: Awake, no distress.  CV:  Good peripheral perfusion.  Resp:  Normal effort.  Abd:  No distention.  Mild tenderness in the epigastric region no lower quadrant tenderness Neuro:             Awake, Alert, Oriented x 3  Other:  Aox3, nml speech  PERRL, EOMI, face symmetric, nml tongue movement  5/5 strength in the BL upper and lower extremities  Sensation grossly intact in the BL upper and lower extremities  Finger-nose-finger intact BL    ED Results / Procedures / Treatments  Labs (all labs ordered are listed, but only abnormal results are displayed) Labs Reviewed  URINALYSIS, ROUTINE W REFLEX MICROSCOPIC - Abnormal; Notable for the following components:      Result Value   Color, Urine YELLOW (*)  APPearance HAZY (*)    Ketones, ur 5 (*)    Leukocytes,Ua MODERATE (*)    All other components within normal limits  CBC WITH DIFFERENTIAL/PLATELET - Abnormal; Notable for the following components:   Hemoglobin 11.7 (*)    MCV 72.4 (*)    MCH 23.4 (*)    RDW 16.4 (*)    All other components within normal limits  COMPREHENSIVE METABOLIC PANEL - Abnormal; Notable for the following components:   Potassium 3.3 (*)    Creatinine, Ser 0.42 (*)    All other components within normal limits  HCG, QUANTITATIVE, PREGNANCY - Abnormal; Notable for the following components:   hCG, Beta Chain, Quant, S 62,150 (*)    All other components within normal limits  POC URINE PREG, ED - Abnormal; Notable for the following components:   Preg Test, Ur POSITIVE (*)    All other components within normal limits  URINE CULTURE  LIPASE, BLOOD     EKG     RADIOLOGY I performed a bedside ultrasound which shows an intrauterine pregnancy fetal heart rate  160   PROCEDURES:  Critical Care performed: No  Procedures   MEDICATIONS ORDERED IN ED: Medications  metoCLOPramide (REGLAN) tablet 10 mg (has no administration in time range)  alum & mag hydroxide-simeth (MAALOX/MYLANTA) 200-200-20 MG/5ML suspension 30 mL (30 mLs Oral Given 06/24/22 0225)  famotidine (PEPCID) tablet 20 mg (20 mg Oral Given 06/24/22 0226)  acetaminophen (TYLENOL) tablet 1,000 mg (1,000 mg Oral Given 06/24/22 0226)     IMPRESSION / MDM / ASSESSMENT AND PLAN / ED COURSE  I reviewed the triage vital signs and the nursing notes.                              Patient's presentation is most consistent with acute complicated illness / injury requiring diagnostic workup.  Differential diagnosis includes, but is not limited to, migraine headache tension headache, cerebral venous sinus thrombosis less likely subarachnoid hemorrhage  Gastritis GERD pancreatitis    The patient is a 26 year old female who is approximately [redacted] weeks pregnant by LMP but who has not yet had prenatal care presents because of both headache and abdominal pain.  Both of the symptoms have been going on for over a month.  The headache is located on the top of her head and is without associated fever visual change or other neurologic symptoms.  She does have history of migraine headache.  Has been taking Tylenol for it.  Currently her abdominal pain is more bothersome to her than the headache.  She looks well and has a nonfocal neurologic exam my suspicion for cerebral venous sinus thrombosis subarachnoid or other more serious cause of headache is low especially given the chronicity.  Plan to treat supportively with Tylenol and Reglan.  Patient's abdominal pain is currently more bothersome to her.  It is in the epigastric region worse with vomiting and eating and has also been going on for about a month.  She has no associated urinary symptoms or vaginal bleeding.  She has mild tenderness in the epigastric  region.  LFTs and lipase are normal.  No significant leukocytosis.  Patient's UA does have some white cells however there is no bacteria and it is contaminated sample with 6-10 squames.  Will not treat for asymptomatic bacteriuria but will try sending urine culture.  I performed a bedside ultrasound she does have an intrauterine pregnancy with a fetal heart rate  about 160.  Given the location of the pain and the chronicity I suspect that it is likely GERD versus gastritis or peptic ulcer.  She was given a GI cocktail and Pepcid did feel improved tolerated p.o.  Will send prescription for pyridoxine and Protonix.  Encouraged OB/GYN follow-up.  We discussed return precautions.   FINAL CLINICAL IMPRESSION(S) / ED DIAGNOSES   Final diagnoses:  Epigastric pain  Nonintractable headache, unspecified chronicity pattern, unspecified headache type     Rx / DC Orders   ED Discharge Orders          Ordered    pantoprazole (PROTONIX) 40 MG tablet  Daily        06/24/22 0315    pyridOXINE (VITAMIN B6) 25 MG tablet  3 times daily PRN        06/24/22 0315             Note:  This document was prepared using Dragon voice recognition software and may include unintentional dictation errors.   Rada Hay, MD 06/24/22 [phone]

## 2022-06-25 LAB — URINE CULTURE: Culture: <10000 — AB

## 2022-07-02 ENCOUNTER — Encounter: Payer: Self-pay | Admitting: Nurse Practitioner

## 2022-07-02 ENCOUNTER — Ambulatory Visit: Payer: Medicaid Other | Admitting: Nurse Practitioner

## 2022-07-02 VITALS — BP 109/68 | HR 89 | Temp 98.3°F | Wt 209.0 lb

## 2022-07-02 DIAGNOSIS — Z9049 Acquired absence of other specified parts of digestive tract: Secondary | ICD-10-CM | POA: Insufficient documentation

## 2022-07-02 DIAGNOSIS — O0991 Supervision of high risk pregnancy, unspecified, first trimester: Secondary | ICD-10-CM | POA: Diagnosis not present

## 2022-07-02 DIAGNOSIS — O99210 Obesity complicating pregnancy, unspecified trimester: Secondary | ICD-10-CM | POA: Insufficient documentation

## 2022-07-02 DIAGNOSIS — O99211 Obesity complicating pregnancy, first trimester: Secondary | ICD-10-CM

## 2022-07-02 DIAGNOSIS — Z8719 Personal history of other diseases of the digestive system: Secondary | ICD-10-CM

## 2022-07-02 DIAGNOSIS — O0990 Supervision of high risk pregnancy, unspecified, unspecified trimester: Secondary | ICD-10-CM | POA: Insufficient documentation

## 2022-07-02 HISTORY — DX: Personal history of other diseases of the digestive system: Z87.19

## 2022-07-02 LAB — HEMOGLOBIN, FINGERSTICK: Hemoglobin: 11.3 g/dL (ref 11.1–15.9)

## 2022-07-02 LAB — WET PREP FOR TRICH, YEAST, CLUE
Trichomonas Exam: NEGATIVE
Yeast Exam: NEGATIVE

## 2022-07-02 NOTE — Progress Notes (Addendum)
Patient presents to prenatal visit with intermittent cough denies temperature or other symptoms. States cough x 5 days. COVID Self Testing performed and negative. Hx UTI in September. Taking Tylenol for headache. H/o gastritis during pregnancy.  Other births at [facility]. Declines Flu Vaccine in pregnancy. [facility] Spanish Interpreter Roddie Mc) and [address] Interpretors [phone]) utilized. In House Labs reviewed at visit WNL. BThiele RN

## 2022-07-02 NOTE — Progress Notes (Signed)
[place] Department  [facility] Clinic   INITIAL PRENATAL VISIT NOTE  Subjective:  Katie Olson is a 26 y.o. [phone] at [redacted]w[redacted]d being seen today to start prenatal care at the [facility] Department.  She is currently monitored for the following issues for this high-risk pregnancy and has Language barrier; Symptomatic cholelithiasis; History of cholecystectomy 10/02/21; History of gastritis (06/24/22); Obesity affecting pregnancy, antepartum; and Supervision of high risk pregnancy, antepartum on their problem list.  Patient reports  tiredness and nausea .  Contractions: Not present. Vag. Bleeding: None.  Movement: Absent. Denies leaking of fluid.   Indications for ASA therapy (per uptodate) One of the following: Previous pregnancy with preeclampsia, especially early onset and with an adverse outcome No Multifetal gestation No Chronic hypertension No Type 1 or 2 diabetes mellitus No Chronic kidney disease No Autoimmune disease (antiphospholipid syndrome, systemic lupus erythematosus) No  Two or more of the following: Nulliparity No Obesity (body mass index >30 kg/m2) Yes Family history of preeclampsia in mother or sister No Age ?35 years No Sociodemographic characteristics (African American race, low socioeconomic level) Yes Personal risk factors (eg, previous pregnancy with low birth weight or small for gestational age infant, previous adverse pregnancy outcome [eg, stillbirth], interval >10 years between pregnancies) No   The following portions of the patient's history were reviewed and updated as appropriate: allergies, current medications, past family history, past medical history, past social history, past surgical history and problem list. Problem list updated.  Objective:   Vitals:   07/02/22 0817  BP: 109/68  Pulse: 89  Temp: 98.3 F (36.8 C)  Weight: 209 lb (94.8 kg)    Fetal Status: Fetal Heart Rate (bpm): 160 Fundal Height: 11 cm  Movement: Absent  Presentation: Undeterminable   Physical Exam Vitals and nursing note reviewed.  Constitutional:      General: She is not in acute distress.    Appearance: Normal appearance. She is well-developed.  HENT:     Head: Normocephalic and atraumatic.     Right Ear: External ear normal.     Left Ear: External ear normal.     Nose: Nose normal. No congestion or rhinorrhea.     Mouth/Throat:     Lips: Pink.     Mouth: Mucous membranes are moist.     Dentition: Normal dentition. No dental caries.     Pharynx: Oropharynx is clear. Uvula midline.     Comments: Dentition: good dentition, never has been to a dentist  Eyes:     General: No scleral icterus.    Conjunctiva/sclera: Conjunctivae normal.  Neck:     Thyroid: No thyroid mass or thyromegaly.  Cardiovascular:     Rate and Rhythm: Normal rate.     Pulses: Normal pulses.     Comments: Extremities are warm and well perfused Pulmonary:     Effort: Pulmonary effort is normal.     Breath sounds: Normal breath sounds.  Chest:     Chest wall: No mass.  Breasts:    Tanner Score is 5.     Breasts are symmetrical.     Right: Normal. No mass, nipple discharge or skin change.     Left: Normal. No mass, nipple discharge or skin change.  Abdominal:     General: Abdomen is flat.     Palpations: Abdomen is soft.     Tenderness: There is no abdominal tenderness.     Comments: Gravid   Genitourinary:    General: Normal vulva.  Exam position: Lithotomy position.     Pubic Area: No rash.      Labia:        Right: No rash.        Left: No rash.      Vagina: Normal. No vaginal discharge.     Cervix: No cervical motion tenderness or friability.     Uterus: Normal. Enlarged (Gravid 12 size). Not tender.      Adnexa: Right adnexa normal and left adnexa normal.     Rectum: Normal. No external hemorrhoid.     Comments: Amount Discharge: small  Odor: No pH: less than 4.5 Adheres to vaginal wall: No Color:    Musculoskeletal:     Cervical back: Full passive range of motion without pain, normal range of motion and neck supple.     Right lower leg: No edema.     Left lower leg: No edema.  Lymphadenopathy:     Cervical: No cervical adenopathy.     Upper Body:     Right upper body: No axillary adenopathy.     Left upper body: No axillary adenopathy.  Skin:    General: Skin is warm and dry.     Capillary Refill: Capillary refill takes less than 2 seconds.  Neurological:     Mental Status: She is alert and oriented to person, place, and time.  Psychiatric:        Attention and Perception: Attention normal.        Mood and Affect: Mood normal.        Speech: Speech normal.        Behavior: Behavior normal. Behavior is cooperative.     Assessment and Plan:  Pregnancy: G4P3003 at [redacted]w[redacted]d  1. History of cholecystectomy -Cholecystectomy 10/12/21.  2. History of gastritis -ED visit on 06/24/22 for epigastric pain.  Patient diagnosed with gastritis.   Patient given Protonix and Vitamin B6.  Patient states she continues to take this medication.   3. Obesity affecting pregnancy, antepartum, unspecified obesity type -Patient given recommended weight gain during pregnancy. -Patient advised to start taking ASA daily at 12 weeks for prevention of preeclampsia.  -MNT referral submitted.  Recommended increasing water intake and physical activity, and limiting sugars and carbs.  - Amb ref to Medical Nutrition Therapy-MNT   4. Supervision of high risk pregnancy, antepartum -26 year old female in clinic today for prenatal care. -Patient states she is taking PNV daily. -Patient reports some nausea and vomiting.  Patient encouraged to eat meals high in protein every 2 hours for nausea and also given resource sheet. Will continue to monitor.   -Patient excited about the pregnancy. -Lives with husband and children.  Is not working currently.  -Patient states last LMP 04/14/22, dating U/S submitted.  -Last  PAP 10/04/2020, next PAP due 09/2023 -Denies alcohol and drug use.  UDS performed today.  -Patient given dental information, patient reports never being to a dentist.   -Declines genetic counseling.  Agrees to [employer] -Patient states she received COVID vaccines in the past.  Patient declined flu today.   -Patient is currently 11 weeks, ASA recommended for prevention of preeclampsia.  -PHQ-9= 2, will continue to monitor.  -Hgb= 11.3    - Glucose, 1 hour gestational - Hgb A1c w/o eAG - Comprehensive metabolic panel - Protein / creatinine ratio, urine  (Spot) - TSH - Prenatal Profile I - MaterniT21 PLUS Core - WET PREP FOR TRICH, YEAST, CLUE - 836629 Drug Screen - Hemoglobin, venipuncture  Discussed overview of care and coordination with inpatient delivery practices including [facility], [facility], [facility] and [facility].    Term labor symptoms and general obstetric precautions including but not limited to vaginal bleeding, contractions, leaking of fluid and fetal movement were reviewed in detail with the patient.  Please refer to After Visit Summary for other counseling recommendations.   Due to a language barrier an interpreter Jamas Lav [place] was used for the provider portion of the visit.     Return in about 4 weeks (around 07/30/2022) for Routine prenatal care visit.    Gregary Cromer, FNP

## 2022-07-03 NOTE — Progress Notes (Signed)
[place] referral received today 07/03/22 by provider A. White, FNP and faxed today to [facility] with okay confirmation.   Earlyne Iba, RN

## 2022-07-04 LAB — 789231 7+OXYCODONE-BUND
Amphetamines, Urine: NEGATIVE ng/mL
BENZODIAZ UR QL: NEGATIVE ng/mL
Barbiturate screen, urine: NEGATIVE ng/mL
Cannabinoid Quant, Ur: NEGATIVE ng/mL
Cocaine (Metab.): NEGATIVE ng/mL
OPIATE SCREEN URINE: NEGATIVE ng/mL
Oxycodone/Oxymorphone, Urine: NEGATIVE ng/mL
PCP Quant, Ur: NEGATIVE ng/mL

## 2022-07-04 LAB — PROTEIN / CREATININE RATIO, URINE
Creatinine, Urine: 212.8 mg/dL
Protein, Ur: 44 mg/dL
Protein/Creat Ratio: 207 mg/g creat — ABNORMAL HIGH (ref 0–200)

## 2022-07-06 LAB — MATERNIT 21 PLUS CORE, BLOOD
Fetal Fraction: 4
Result (T21): NEGATIVE
Trisomy 13 (Patau syndrome): NEGATIVE
Trisomy 18 (Edwards syndrome): NEGATIVE
Trisomy 21 (Down syndrome): NEGATIVE

## 2022-07-06 LAB — PREGNANCY, INITIAL SCREEN
Antibody Screen: NEGATIVE
Basophils Absolute: 0 10*3/uL (ref 0.0–0.2)
Basos: 0 %
Bilirubin, UA: NEGATIVE
Chlamydia trachomatis, NAA: NEGATIVE
EOS (ABSOLUTE): 0.1 10*3/uL (ref 0.0–0.4)
Eos: 2 %
Glucose, UA: NEGATIVE
HCV Ab: NONREACTIVE
HIV Screen 4th Generation wRfx: NONREACTIVE
Hematocrit: 35.4 % (ref 34.0–46.6)
Hemoglobin: 11.3 g/dL (ref 11.1–15.9)
Hepatitis B Surface Ag: NEGATIVE
Immature Grans (Abs): 0 10*3/uL (ref 0.0–0.1)
Immature Granulocytes: 0 %
Leukocytes,UA: NEGATIVE
Lymphocytes Absolute: 1.4 10*3/uL (ref 0.7–3.1)
Lymphs: 21 %
MCH: 23.6 pg — ABNORMAL LOW (ref 26.6–33.0)
MCHC: 31.9 g/dL (ref 31.5–35.7)
MCV: 74 fL — ABNORMAL LOW (ref 79–97)
Monocytes Absolute: 0.3 10*3/uL (ref 0.1–0.9)
Monocytes: 5 %
Neisseria Gonorrhoeae by PCR: NEGATIVE
Neutrophils Absolute: 4.9 10*3/uL (ref 1.4–7.0)
Neutrophils: 72 %
Nitrite, UA: NEGATIVE
Platelets: 398 10*3/uL (ref 150–450)
RBC, UA: NEGATIVE
RBC: 4.78 x10E6/uL (ref 3.77–5.28)
RDW: 16.2 % — ABNORMAL HIGH (ref 11.7–15.4)
RPR Ser Ql: NONREACTIVE
Rh Factor: POSITIVE
Rubella Antibodies, IGG: 3.63 index (ref >0.99)
Specific Gravity, UA: 1.023 (ref 1.005–1.030)
Urobilinogen, Ur: 1 mg/dL (ref 0.2–1.0)
WBC: 6.7 10*3/uL (ref 3.4–10.8)
pH, UA: 7.5 (ref 5.0–7.5)

## 2022-07-06 LAB — HCV INTERPRETATION

## 2022-07-06 LAB — COMPREHENSIVE METABOLIC PANEL
ALT: 29 IU/L (ref 0–32)
AST: 20 IU/L (ref 0–40)
Albumin/Globulin Ratio: 1.2 (ref 1.2–2.2)
Albumin: 3.8 g/dL — ABNORMAL LOW (ref 4.0–5.0)
Alkaline Phosphatase: 87 IU/L (ref 44–121)
BUN/Creatinine Ratio: 6 — ABNORMAL LOW (ref 9–23)
BUN: 3 mg/dL — ABNORMAL LOW (ref 6–20)
Bilirubin Total: <0.2 mg/dL (ref 0.0–1.2)
CO2: 19 mmol/L — ABNORMAL LOW (ref 20–29)
Calcium: 9 mg/dL (ref 8.7–10.2)
Chloride: 104 mmol/L (ref 96–106)
Creatinine, Ser: 0.49 mg/dL — ABNORMAL LOW (ref 0.57–1.00)
Globulin, Total: 3.3 g/dL (ref 1.5–4.5)
Glucose: 127 mg/dL — ABNORMAL HIGH (ref 70–99)
Potassium: 3.7 mmol/L (ref 3.5–5.2)
Sodium: 139 mmol/L (ref 134–144)
Total Protein: 7.1 g/dL (ref 6.0–8.5)
eGFR: 135 mL/min/{1.73_m2} (ref >59)

## 2022-07-06 LAB — MICROSCOPIC EXAMINATION: Casts: NONE SEEN /lpf

## 2022-07-06 LAB — TSH: TSH: 0.197 u[IU]/mL — ABNORMAL LOW (ref 0.450–4.500)

## 2022-07-06 LAB — GLUCOSE, 1 HOUR GESTATIONAL: Gestational Diabetes Screen: 114 mg/dL (ref 70–139)

## 2022-07-06 LAB — HGB A1C W/O EAG: Hgb A1c MFr Bld: 5.5 % (ref 4.8–5.6)

## 2022-07-06 LAB — URINE CULTURE, OB REFLEX

## 2022-07-28 NOTE — L&D Delivery Note (Signed)
Delivery Note  Katie Olson is a [id number] at [redacted]w[redacted]d with an LMP of 9/18 First Stage: Labor onset: 2245 Augmentation: AROM Analgesia /Anesthesia intrapartum: none AROM at 0017 GBS: negative IP Antibiotics: none  Second Stage: Complete dilation at 0017 Onset of pushing at 0024 FHR second stage 135 bpm with moderate, variable decels with pushing   Katie Olson presented to L&D in advanced labor. She was 7/90/-1. She progressed  to C/C/+2 with a spontaneous urge to push.  She pushed  effectively over approximately 1 minutes for a spontaneous vaginal birth.  Delivery of a viable baby boy on 01/08/23 at 0025 by CNM Delivery of fetal head in OA position with restitution to LOT. loose nuchal cord reduced;  Anterior then posterior shoulders delivered easily with gentle downward traction. Baby placed on mom's chest, and attended to by baby RN Cord double clamped after cessation of pulsation, cut by FOB  Cord blood sample collection: Yes PENDING Collection of cord blood donation no Arterial cord blood sample  no  Third Stage: Oxytocin bolus started after delivery of infant for hemorrhage prophylaxis  Placenta delivered schultz intact with 3 VC @ 0031 Placenta disposition: discarded per protocol Uterine tone firm / bleeding scant  no laceration identified  Anesthesia for repair: none Repair n/a Est. Blood Loss (mL): 50  Complications: none  Mom to postpartum.  Baby to Couplet care / Skin to Skin.  Newborn: Information for the patient's newborn:  Katie Olson, Katie Olson [[id number]]  Live born female  Birth Weight:   APGAR: 8, 9  Newborn Delivery   Birth date/time: 01/08/2023 00:25:00 Delivery type:        Feeding planned: formula feeding  ---------- Chari Manning, CNM Certified Nurse Midwife [place]  [facility]

## 2022-07-30 ENCOUNTER — Telehealth: Payer: Self-pay

## 2022-07-30 ENCOUNTER — Ambulatory Visit: Payer: Medicaid Other

## 2022-07-30 NOTE — Telephone Encounter (Signed)
[place] Interpretor # [phone] utilized. Patient did not remember appointment for today. Rescheduled appointment for 08/04/2022. BThiele RN

## 2022-08-04 ENCOUNTER — Ambulatory Visit: Payer: Medicaid Other

## 2022-08-08 NOTE — Addendum Note (Signed)
Addended by: Cletis Media on: 08/08/2022 03:48 PM   Modules accepted: Orders

## 2022-08-09 IMAGING — CT CT HEAD W/O CM
4 series · 17 of 47 positions shown, 19 images · non-contrast
Comparison: None Available.

CLINICAL DATA: Fever, headache and septic. Concern for
encephalitis/meningitis.



[Series 2: head bone · axial · 0.45mm/px · z∈[-153,-95]mm · 4 of 84 slices shown]
[im 9/84  bone]
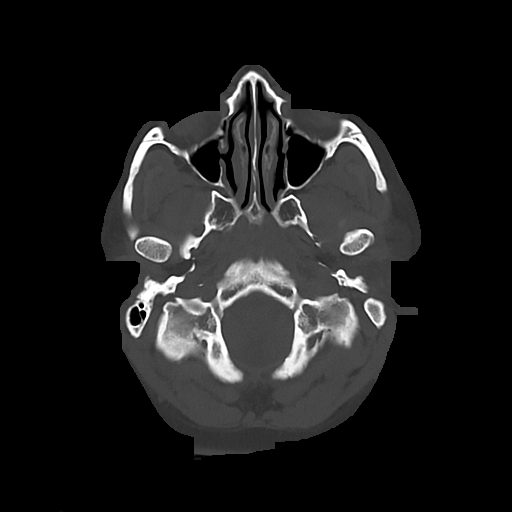
[im 17/84  bone]
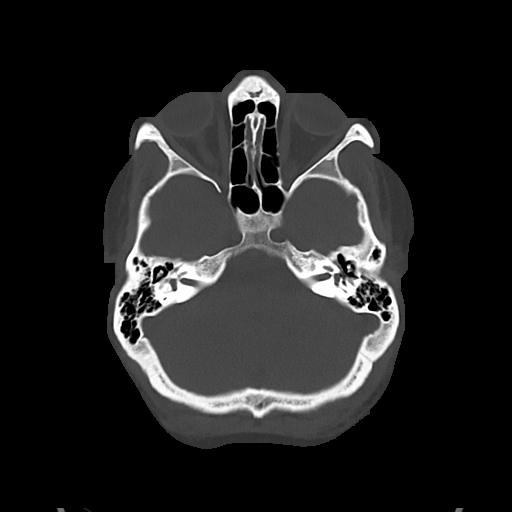
[im 25/84  bone]
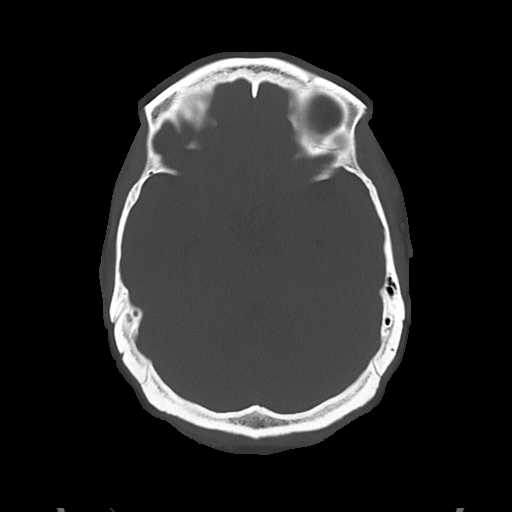
[im 38/84  bone]
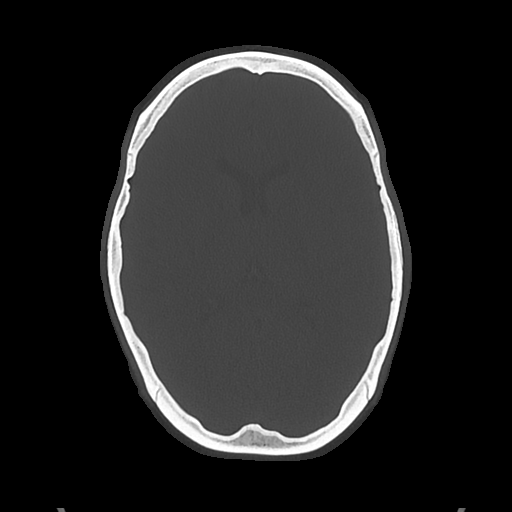

[Series 3: head wo · axial · 0.45mm/px · z∈[-149,-29]mm · 7 of 34 slices shown, 9 images]
[im 5/34  brain]
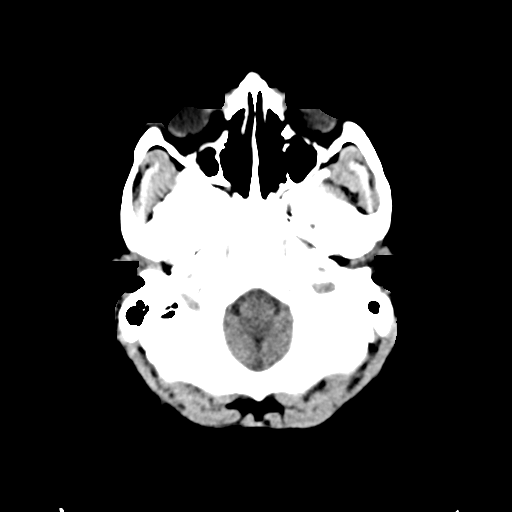
[im 5/34  bone]
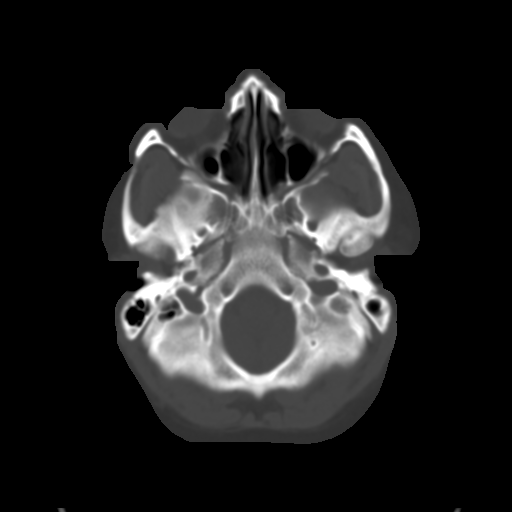
[im 9/34  brain]
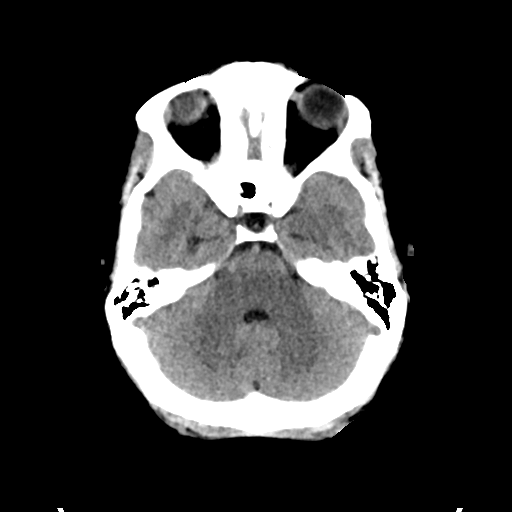
[im 13/34  brain]
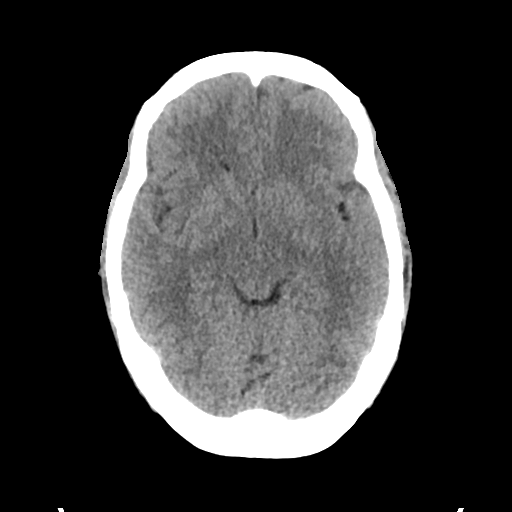
[im 17/34  brain]
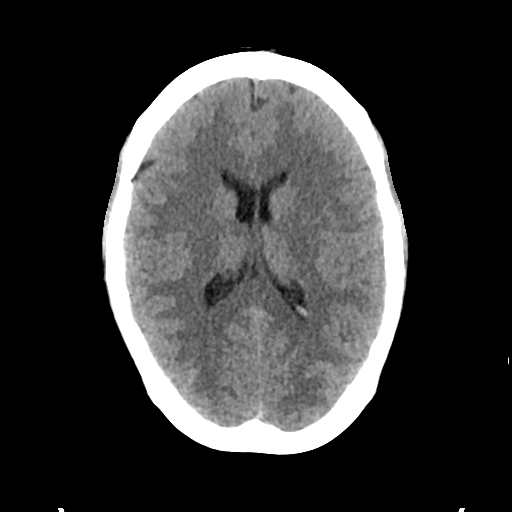
[im 21/34  brain]
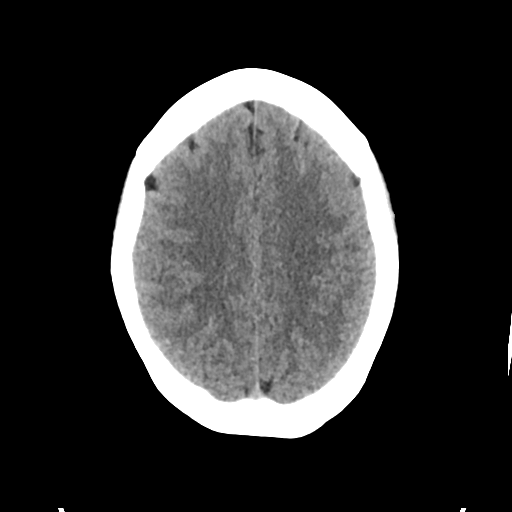
[im 21/34  bone]
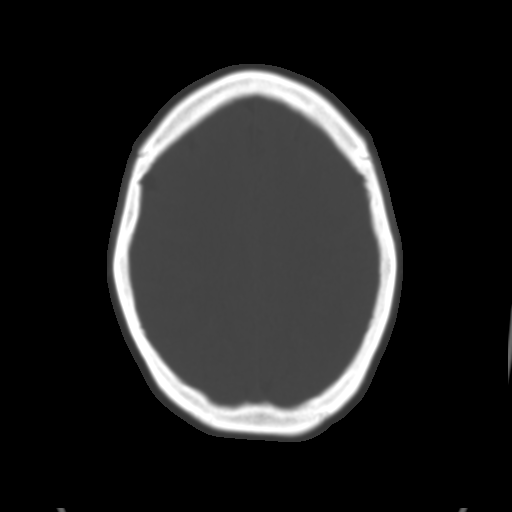
[im 25/34  brain]
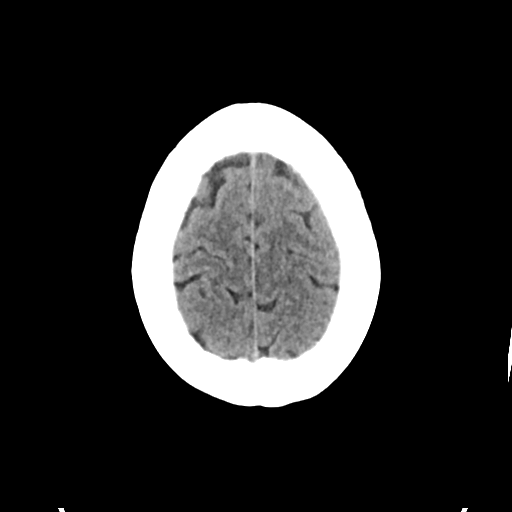
[im 29/34  brain]
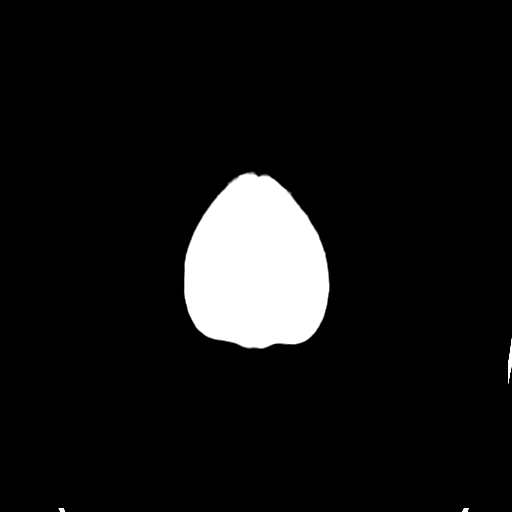

[Series 4: cor soft · coronal · 0.32mm/px · 3 of 65 slices shown]
[im 22/65  brain]
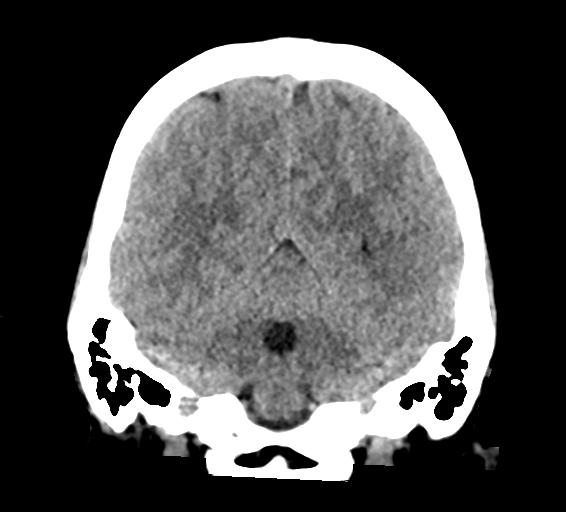
[im 29/65  brain]
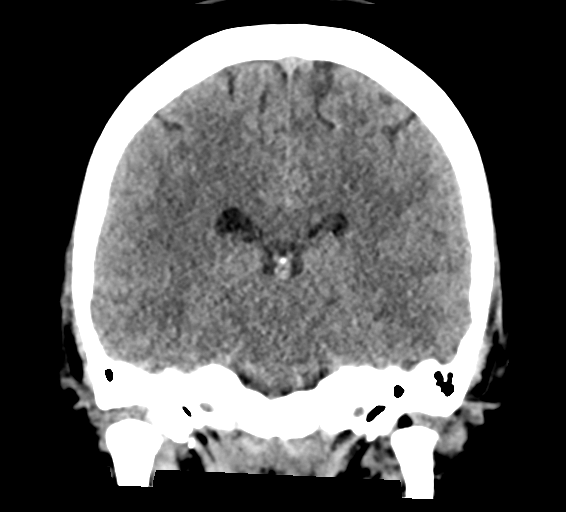
[im 36/65  brain]
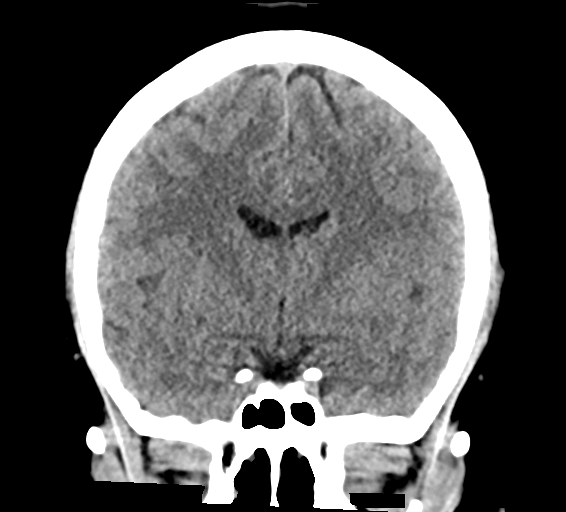

[Series 5: sag soft · sagittal · 0.32mm/px · 3 of 59 slices shown]
[im 20/59  brain]
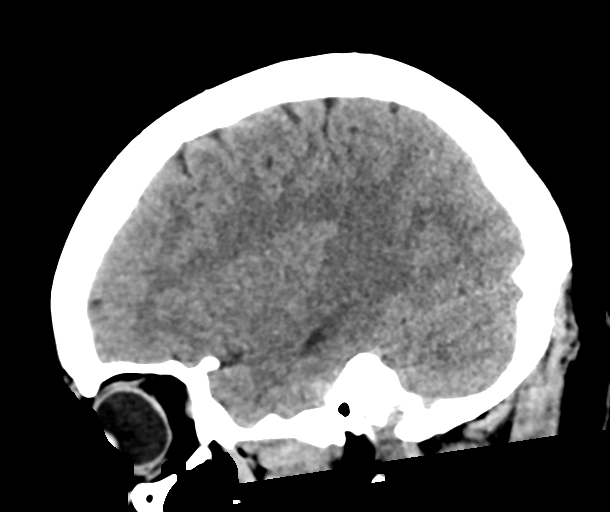
[im 30/59  brain]
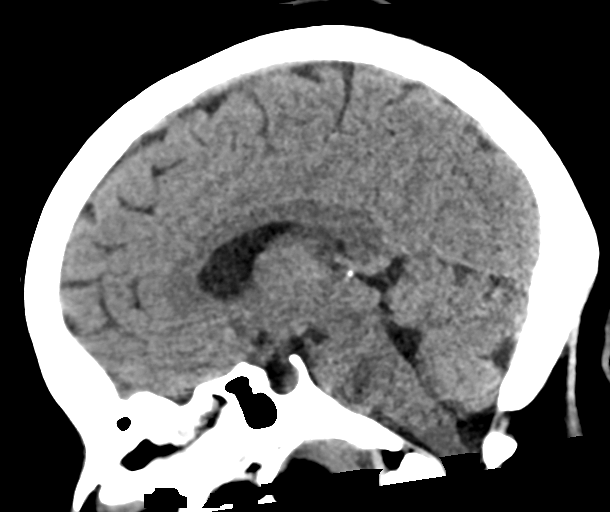
[im 39/59  brain]
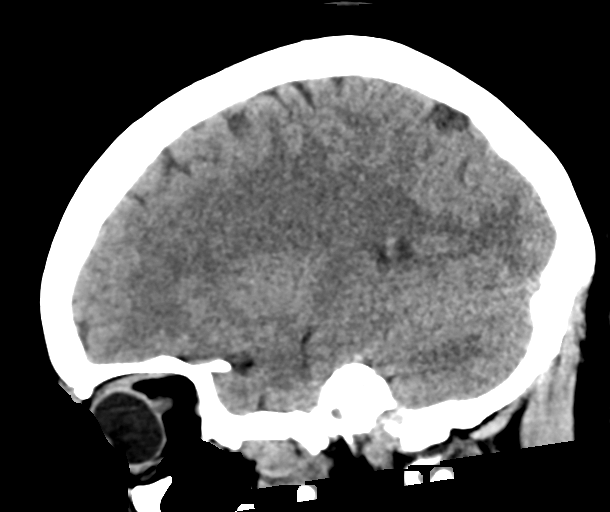

[17 of 47 positions shown; findings below may reference images not displayed]

FINDINGS: Brain: No evidence of acute infarction, hemorrhage, hydrocephalus,
extra-axial collection or mass lesion/mass effect.

Vascular: No hyperdense vessel or unexpected calcification.

Skull: Normal. Negative for fracture or focal lesion.

Sinuses/Orbits: Mastoid air cells and paranasal sinuses are clear.

Other: None
IMPRESSION: No acute intracranial abnormalities.

## 2022-08-13 ENCOUNTER — Ambulatory Visit: Payer: Medicaid Other | Admitting: Advanced Practice Midwife

## 2022-08-13 VITALS — BP 114/61 | HR 80 | Temp 97.2°F | Wt 211.2 lb

## 2022-08-13 DIAGNOSIS — O0990 Supervision of high risk pregnancy, unspecified, unspecified trimester: Secondary | ICD-10-CM

## 2022-08-13 DIAGNOSIS — O99210 Obesity complicating pregnancy, unspecified trimester: Secondary | ICD-10-CM

## 2022-08-13 DIAGNOSIS — O0992 Supervision of high risk pregnancy, unspecified, second trimester: Secondary | ICD-10-CM

## 2022-08-13 DIAGNOSIS — O99212 Obesity complicating pregnancy, second trimester: Secondary | ICD-10-CM

## 2022-08-13 DIAGNOSIS — R7989 Other specified abnormal findings of blood chemistry: Secondary | ICD-10-CM

## 2022-08-13 MED ORDER — PRENATAL MULTIVITAMIN CH: 1.0000 | ORAL_TABLET | Freq: Every day | ORAL | 0 refills | Status: AC

## 2022-08-13 NOTE — Progress Notes (Signed)
Patient here for MH RV at [redacted]w[redacted]d.   Patient aware of [facility] Anat [place] on 08/15/22 for 2pm arrival time.   PNV dispensed to patient per request.   Patient consented to AFP spina bifida lab.   Al Decant, RN

## 2022-08-13 NOTE — Progress Notes (Signed)
[place] Department M[facility] Health Clinic  PRENATAL VISIT NOTE  Subjective:  Katie Olson is a 27 y.o. [phone] at [redacted]w[redacted]d being seen today for ongoing prenatal care.  She is currently monitored for the following issues for this high-risk pregnancy and has Language barrier; Symptomatic cholelithiasis; History of cholecystectomy 10/02/21; History of gastritis (06/24/22); Obesity affecting pregnancy, antepartum; Supervision of high risk pregnancy, antepartum; Hx macrosomic infant 05/06/21 9#7.5; and Low TSH 07/02/22 (0.197) on their problem list.  Patient reports no complaints.  Contractions: Not present. Vag. Bleeding: None.  Movement: Absent. Denies leaking of fluid/ROM.   The following portions of the patient's history were reviewed and updated as appropriate: allergies, current medications, past family history, past medical history, past social history, past surgical history and problem list. Problem list updated.  Objective:   Vitals:   08/13/22 1416  BP: 114/61  Pulse: 80  Temp: (!) 97.2 F (36.2 C)  Weight: 211 lb 3.2 oz (95.8 kg)    Fetal Status: Fetal Heart Rate (bpm): 120 Fundal Height: 20 cm Movement: Absent     General:  Alert, oriented and cooperative. Patient is in no acute distress.  Skin: Skin is warm and dry. No rash noted.   Cardiovascular: Normal heart rate noted  Respiratory: Normal respiratory effort, no problems with respiration noted  Abdomen: Soft, gravid, appropriate for gestational age.  Pain/Pressure: Absent     Pelvic: Cervical exam deferred        Extremities: Normal range of motion.  Edema: None  Mental Status: Normal mood and affect. Normal behavior. Normal judgment and thought content.   Assessment and Plan:  Pregnancy: G4P3003 at [redacted]w[redacted]d  1. Supervision of high risk pregnancy, antepartum AFP today Not working Here with mother in law and her 2 kids Declines genetic counseling NIPS neg 07/02/22 1 hour glucola=114 on 07/02/22 Doesn't  want BTL Reviewed 07/25/22 u/s at 16 1/7 with EDC=01/08/23 Has anatomy u/s 08/15/22  - TSH + free T4 - T3, free - AFP, Serum, Open Spina Bifida - Lead, blood (adult age 70 yrs or greater) - Prenatal Vit-Fe Fumarate-FA (PRENATAL MULTIVITAMIN) TABS tablet; Take 1 tablet by mouth daily at 12 noon.  Dispense: 100 tablet; Refill: 0  2. Obesity affecting pregnancy, antepartum, unspecified obesity type Taking ASA 81 mg daily -1 lb 12.8 oz (-0.816 kg) Not exercising; encouraged to exercise 3x/wk x 20 min  3. Hx macrosomic infant 05/06/21 9#   4. Low TSH 07/02/22 (0.197) Repeat TSH, free T4, T3 today   Preterm labor symptoms and general obstetric precautions including but not limited to vaginal bleeding, contractions, leaking of fluid and fetal movement were reviewed in detail with the patient. Please refer to After Visit Summary for other counseling recommendations.  Return in about 4 weeks (around 09/10/2022) for routine PNC.  No future appointments.  Herbie Saxon, CNM

## 2022-08-16 LAB — AFP, SERUM, OPEN SPINA BIFIDA
AFP MoM: 1.41
AFP Value: 56.7 ng/mL
Gest. Age on Collection Date: 18.9 weeks
Maternal Age At EDD: 25.4 yr
OSBR Risk 1 IN: 3501
Test Results:: NEGATIVE
Weight: 211 [lb_av]

## 2022-08-16 LAB — LEAD, BLOOD (ADULT >= 16 YRS): Lead-Whole Blood: <1 ug/dL (ref 0.0–3.4)

## 2022-08-16 LAB — TSH+FREE T4
Free T4: 1.14 ng/dL (ref 0.82–1.77)
TSH: 1.16 u[IU]/mL (ref 0.450–4.500)

## 2022-08-16 LAB — T3, FREE: T3, Free: 3.5 pg/mL (ref 2.0–4.4)

## 2022-08-27 NOTE — Addendum Note (Signed)
Addended by: Cletis Media on: 08/27/2022 03:57 PM   Modules accepted: Orders

## 2022-09-10 ENCOUNTER — Ambulatory Visit: Payer: Medicaid Other | Admitting: Advanced Practice Midwife

## 2022-09-10 VITALS — BP 111/86 | HR 92 | Temp 97.5°F | Wt 212.8 lb

## 2022-09-10 DIAGNOSIS — O0990 Supervision of high risk pregnancy, unspecified, unspecified trimester: Secondary | ICD-10-CM

## 2022-09-10 DIAGNOSIS — O0992 Supervision of high risk pregnancy, unspecified, second trimester: Secondary | ICD-10-CM

## 2022-09-10 DIAGNOSIS — O99212 Obesity complicating pregnancy, second trimester: Secondary | ICD-10-CM

## 2022-09-10 DIAGNOSIS — O99210 Obesity complicating pregnancy, unspecified trimester: Secondary | ICD-10-CM

## 2022-09-10 NOTE — Progress Notes (Signed)
Patient here for MH RV at [redacted]w[redacted]d   PHQ9 and CCNC forms filled out today. PHQ9 =2.   Patient states today she is uncertain of desiring BTL. She says she wants something long term and non-hormonal. Gave patient information on [employer] and answered questions. Informed patient she can still sign BTL consent next visit and she can decide at the hospital after delivery if she still wants BTL. Informed her it is better to sign consent so medicaid can pay for it and then change her mind later if she decides to do so. Patient verbalized understanding.   EAl Decant RN

## 2022-09-10 NOTE — Progress Notes (Signed)
[place] Department M[facility] Clinic  PRENATAL VISIT NOTE  Subjective:  Katie Olson is a 27 y.o. [phone] at 62w6dbeing seen today for ongoing prenatal care.  She is currently monitored for the following issues for this high-risk pregnancy and has Language barrier; Symptomatic cholelithiasis; History of cholecystectomy 10/02/21; History of gastritis (06/24/22); Obesity affecting pregnancy, antepartum; Supervision of high risk pregnancy, antepartum; Hx macrosomic infant 05/06/21 9#7.5; and Low TSH 07/02/22 (0.197) on their problem list.  Patient reports no complaints.  Contractions: Not present. Vag. Bleeding: None.  Movement: Present. Denies leaking of fluid/ROM.   The following portions of the patient's history were reviewed and updated as appropriate: allergies, current medications, past family history, past medical history, past social history, past surgical history and problem list. Problem list updated.  Objective:   Vitals:   09/10/22 1045  BP: 111/86  Pulse: 92  Temp: (!) 97.5 F (36.4 C)  Weight: 212 lb 12.8 oz (96.5 kg)    Fetal Status: Fetal Heart Rate (bpm): 124 Fundal Height: 24 cm Movement: Present     General:  Alert, oriented and cooperative. Patient is in no acute distress.  Skin: Skin is warm and dry. No rash noted.   Cardiovascular: Normal heart rate noted  Respiratory: Normal respiratory effort, no problems with respiration noted  Abdomen: Soft, gravid, appropriate for gestational age.  Pain/Pressure: Absent     Pelvic: Cervical exam deferred        Extremities: Normal range of motion.  Edema: None  Mental Status: Normal mood and affect. Normal behavior. Normal judgment and thought content.   Assessment and Plan:  Pregnancy: G4P3003 at 288w6d1. Supervision of high risk pregnancy, antepartum -3.2 oz (-0.091 kg) Not working Walking 3x/wk x 35-45 min Reviewed 08/15/22 u/s at 18 5/7 with posterior placenta, 3VC, AFI wnl, not able to  visualize spine or aortic arch Has f/u u/s 09/19/22 AFP neg 08/13/22  2. Obesity affecting pregnancy, antepartum, unspecified obesity type Taking ASA 81 mg daily -3.2 oz (-0.091 kg) Walking 3x/wk x 35-45 min  3. Hx macrosomic infant 05/06/21 9#7.5     Preterm labor symptoms and general obstetric precautions including but not limited to vaginal bleeding, contractions, leaking of fluid and fetal movement were reviewed in detail with the patient. Please refer to After Visit Summary for other counseling recommendations.  Return in about 4 weeks (around 10/08/2022) for routine PNC.  Future Appointments  Date Time Provider DeNeeses3/13/2024 10:00 AM [facility] PROVIDER [facility] None    ElHerbie SaxonCNM

## 2022-10-08 ENCOUNTER — Ambulatory Visit: Payer: Medicaid Other | Admitting: Advanced Practice Midwife

## 2022-10-08 VITALS — BP 106/66 | HR 94 | Temp 98.0°F | Wt 211.8 lb

## 2022-10-08 DIAGNOSIS — O0990 Supervision of high risk pregnancy, unspecified, unspecified trimester: Secondary | ICD-10-CM

## 2022-10-08 DIAGNOSIS — O99210 Obesity complicating pregnancy, unspecified trimester: Secondary | ICD-10-CM

## 2022-10-08 NOTE — Progress Notes (Signed)
[facility] Spanish Interpretor utilized Jamas Lav). Patient states she desires IUD Postpartum. BTHIELE RN

## 2022-10-08 NOTE — Progress Notes (Signed)
[place] Department [facility] Clinic  PRENATAL VISIT NOTE  Subjective:  Katie Olson is a 27 y.o. [phone] at 84w6dbeing seen today for ongoing prenatal care.  She is currently monitored for the following issues for this high-risk pregnancy and has Language barrier; Symptomatic cholelithiasis; History of cholecystectomy 10/02/21; History of gastritis (06/24/22); Obesity affecting pregnancy, antepartum; Supervision of high risk pregnancy, antepartum; Hx macrosomic infant 05/06/21 9#7.5; and Low TSH 07/02/22 (0.197) on their problem list.  Patient reports no complaints.  Contractions: Not present. Vag. Bleeding: None.  Movement: Present. Denies leaking of fluid/ROM.   The following portions of the patient's history were reviewed and updated as appropriate: allergies, current medications, past family history, past medical history, past social history, past surgical history and problem list. Problem list updated.  Objective:   Vitals:   10/08/22 1001  BP: 106/66  Pulse: 94  Temp: 98 F (36.7 C)  Weight: 211 lb 12.8 oz (96.1 kg)    Fetal Status: Fetal Heart Rate (bpm): 145 Fundal Height: 27 cm Movement: Present     General:  Alert, oriented and cooperative. Patient is in no acute distress.  Skin: Skin is warm and dry. No rash noted.   Cardiovascular: Normal heart rate noted  Respiratory: Normal respiratory effort, no problems with respiration noted  Abdomen: Soft, gravid, appropriate for gestational age.  Pain/Pressure: Absent     Pelvic: Cervical exam deferred        Extremities: Normal range of motion.  Edema: None  Mental Status: Normal mood and affect. Normal behavior. Normal judgment and thought content.   Assessment and Plan:  Pregnancy: G4P3003 at 252w6d1. Supervision of high risk pregnancy, antepartum Walking 3x/wk x 30-45 min Not working Wants IUD pp Reviewed 09/19/22 u/s at 24.0 wks with posterior placenta, anatomy wnl, AFI wnl, appropriate  growth, EFW=39%  2. Obesity affecting pregnancy, antepartum, unspecified obesity type -1 lb 3.2 oz (-0.544 kg) Taking ASA 81 mg daily  3. Hx macrosomic infant 05/06/21 9#7.5    Preterm labor symptoms and general obstetric precautions including but not limited to vaginal bleeding, contractions, leaking of fluid and fetal movement were reviewed in detail with the patient. Please refer to After Visit Summary for other counseling recommendations.  Return in about 2 weeks (around 10/22/2022) for 28 week labs, routine PNC.  No future appointments.  ElHerbie SaxonCNM

## 2022-10-16 NOTE — Progress Notes (Signed)
10-16-2022 [facility] received request for medical records from [place] [facility] [place] for patient Katie Olson, [birth year]. Medical records requested for the purpose of annual HEDIS review. Records requested include [facility]) Prenatal and Postpartum Care for dates of service 2021 to 2023. See scanned copy of letter request. Copies of medical records will be mailed to [facility] and [place] [facility] [place], [address], [address], [place], [place] [address]. Per [facility] medical records department, [employer] [place] will send a shipping label for medical records. If shipping label not received, medical records will be mailed certified mail to the above address. Medical records released include the following: 07-02-2022 initial prenatal visit records and lab test results for 1 hour glucose, Hgb A1c, comprehensive metabolic panel, protein/creatinine ration, urine (SPOT), TSH, Prenatal Profile 1, MaterniT21 PLUC Core, wet prep, urine drug screen, hemoglobin; 01-07-2022 Office visit and pregnancy test lab results; 06-10-2022 office visit for pregnancy test; 05-03-2021 Routine Prenatal  office visit; 04-26-2021 Routine Prenatal office visit; 04-19-2021 Routine Prenatal office visit; 04-12-2021 Routine Prenatal office visit, lab results for GC/Chlamydia testing and Group B Beta Strep culture; 04-17-2021 final lab results for GC/Chlamydia and Group B Beta Strep results; 03-29-2021 Routine Prenatal office visit; 03-15-2021 Routine Prenatal office visit and results of urinalysis; 02-20-2021 Routine Prenatal office visit and lab test results for HIV, Hemoglobin and gestational diabetes screen; 01-18-2021 Routine Prenatal office visit; 12-21-2020 Routine Prenatal office visit4-22-2022 Routine Prenatal office visit; 11-02-2020 Routine Prenatal office visit and lab test results from urinalysis and comprehensive metabolic panel; [id number] Initial Prenatal office visit and lab test results for urine culture,  HIV, GC/Chlamydia NAA, 1 hour gestational glucose, HCV, Hemoglobin, Protein/Creatinine Ratio urine, QuantiFERON TB Gold Plus, comprehensive metabolic panel, urinalysis, prenatal profile with varicella or Rubella, Varicella Zoster antibody IgG, and Pap IG; 11-05-2020 [facility] Ul[facility]ound records; and 12-26-2020 [facility] Ul[facility]ound records. Inetta Fermo RN. 10-16-2022

## 2022-10-22 ENCOUNTER — Ambulatory Visit: Payer: Medicaid Other | Admitting: Advanced Practice Midwife

## 2022-10-22 VITALS — BP 106/65 | HR 73 | Temp 97.0°F | Wt 212.0 lb

## 2022-10-22 DIAGNOSIS — Z23 Encounter for immunization: Secondary | ICD-10-CM

## 2022-10-22 DIAGNOSIS — O0990 Supervision of high risk pregnancy, unspecified, unspecified trimester: Secondary | ICD-10-CM

## 2022-10-22 DIAGNOSIS — O99213 Obesity complicating pregnancy, third trimester: Secondary | ICD-10-CM

## 2022-10-22 DIAGNOSIS — O0993 Supervision of high risk pregnancy, unspecified, third trimester: Secondary | ICD-10-CM

## 2022-10-22 DIAGNOSIS — O99210 Obesity complicating pregnancy, unspecified trimester: Secondary | ICD-10-CM

## 2022-10-22 LAB — HEMOGLOBIN, FINGERSTICK: Hemoglobin: 11.6 g/dL (ref 11.1–15.9)

## 2022-10-22 NOTE — Progress Notes (Signed)
Pt here for MHRV 28w 6d. Pt received Tdap this visit, given VIS and immunization record copy. 28 week labs done today.   Hgb: 11.6; no treatment indicated.  Harland Dingwall, RN

## 2022-10-22 NOTE — Progress Notes (Signed)
[place] [facility]  PRENATAL VISIT NOTE  Subjective:  Katie Olson is a 27 y.o. [phone] at [redacted]w[redacted]d being seen today for ongoing prenatal care.  She is currently monitored for the following issues for this high-risk pregnancy and has Language barrier; Symptomatic cholelithiasis; History of cholecystectomy 10/02/21; History of gastritis (06/24/22); Obesity affecting pregnancy, antepartum; Supervision of high risk pregnancy, antepartum; Hx macrosomic infant 05/06/21 9#7.5; and Low TSH 07/02/22 (0.197) on their problem list.  Patient reports no complaints.  Contractions: Not present. Vag. Bleeding: None.  Movement: Present. Denies leaking of fluid/ROM.   The following portions of the patient's history were reviewed and updated as appropriate: allergies, current medications, past family history, past medical history, past social history, past surgical history and problem list. Problem list updated.  Objective:   Vitals:   10/22/22 1412  BP: 106/65  Pulse: 73  Temp: (!) 97 F (36.1 C)  Weight: 212 lb (96.2 kg)    Fetal Status: Fetal Heart Rate (bpm): 144 Fundal Height: 29 cm Movement: Present     General:  Alert, oriented and cooperative. Patient is in no acute distress.  Skin: Skin is warm and dry. No rash noted.   Cardiovascular: Normal heart rate noted  Respiratory: Normal respiratory effort, no problems with respiration noted  Abdomen: Soft, gravid, appropriate for gestational age.  Pain/Pressure: Absent     Pelvic: Cervical exam deferred        Extremities: Normal range of motion.  Edema: None  Mental Status: Normal mood and affect. Normal behavior. Normal judgment and thought content.   Assessment and Plan:  Pregnancy: G4P3003 at [redacted]w[redacted]d  1. Supervision of high risk pregnancy, antepartum 1 hour glucola today Here with young daughter Not working -1 lb (-0.454 kg)  - Hemoglobin, venipuncture - Glucose, 1 hour gestational - HIV-1/HIV-2  Qualitative RNA - RPR  2. Obesity affecting pregnancy, antepartum, unspecified obesity type Taking ASA 81 mg daily -1 lb (-0.454 kg) Walking 3x/wk x 30-45 min  3. Hx macrosomic infant 05/06/21 9#7.5    Preterm labor symptoms and general obstetric precautions including but not limited to vaginal bleeding, contractions, leaking of fluid and fetal movement were reviewed in detail with the patient. Please refer to After Visit Summary for other counseling recommendations.  Return in about 2 weeks (around 11/05/2022) for routine PNC.  No future appointments.  Herbie Saxon, CNM

## 2022-10-23 LAB — RPR: RPR Ser Ql: NONREACTIVE

## 2022-10-23 LAB — GLUCOSE, 1 HOUR GESTATIONAL: Gestational Diabetes Screen: 100 mg/dL (ref 70–139)

## 2022-10-24 LAB — HIV-1/HIV-2 QUALITATIVE RNA
HIV-1 RNA, Qualitative: NONREACTIVE
HIV-2 RNA, Qualitative: NONREACTIVE

## 2022-11-05 ENCOUNTER — Ambulatory Visit: Payer: Medicaid Other | Admitting: Advanced Practice Midwife

## 2022-11-05 VITALS — BP 110/61 | HR 84 | Temp 97.5°F | Wt 209.2 lb

## 2022-11-05 DIAGNOSIS — O0993 Supervision of high risk pregnancy, unspecified, third trimester: Secondary | ICD-10-CM

## 2022-11-05 DIAGNOSIS — O0990 Supervision of high risk pregnancy, unspecified, unspecified trimester: Secondary | ICD-10-CM

## 2022-11-05 DIAGNOSIS — O99210 Obesity complicating pregnancy, unspecified trimester: Secondary | ICD-10-CM

## 2022-11-05 DIAGNOSIS — O99213 Obesity complicating pregnancy, third trimester: Secondary | ICD-10-CM

## 2022-11-05 NOTE — Progress Notes (Signed)
[facility]  PRENATAL VISIT NOTE  Subjective:  Katie Olson is a 27 y.o. [phone] at [redacted]w[redacted]d being seen today for ongoing prenatal care.  She is currently monitored for the following issues for this high-risk pregnancy and has Language barrier; Symptomatic cholelithiasis; History of cholecystectomy 10/02/21; History of gastritis (06/24/22); Obesity affecting pregnancy, antepartum; Supervision of high risk pregnancy, antepartum; Hx macrosomic infant 05/06/21 9#7.5; and Low TSH 07/02/22 (0.197) on their problem list.  Patient reports heartburn.  Contractions: Not present. Vag. Bleeding: None.  Movement: Present. Denies leaking of fluid/ROM.   The following portions of the patient's history were reviewed and updated as appropriate: allergies, current medications, past family history, past medical history, past social history, past surgical history and problem list. Problem list updated.  Objective:   Vitals:   11/05/22 1046  BP: 110/61  Pulse: 84  Temp: (!) 97.5 F (36.4 C)  Weight: 209 lb 3.2 oz (94.9 kg)    Fetal Status: Fetal Heart Rate (bpm): 124 Fundal Height: 32 cm Movement: Present     General:  Alert, oriented and cooperative. Patient is in no acute distress.  Skin: Skin is warm and dry. No rash noted.   Cardiovascular: Normal heart rate noted  Respiratory: Normal respiratory effort, no problems with respiration noted  Abdomen: Soft, gravid, appropriate for gestational age.  Pain/Pressure: Absent     Pelvic: Cervical exam deferred        Extremities: Normal range of motion.  Edema: None  Mental Status: Normal mood and affect. Normal behavior. Normal judgment and thought content.   Assessment and Plan:  Pregnancy: G4P3003 at [redacted]w[redacted]d  1. Supervision of high risk pregnancy, antepartum Not working Walking 2x/wk x 30-45 min 1 hour glucola=100 on 10/22/22 C/o GERD--suggestions given  2. Obesity affecting pregnancy, antepartum,  unspecified obesity type -3 lb 12.8 oz (-1.724 kg) Lost 3 lb in last 2 wks; denies N&V or diarrhea and has food at home Taking ASA 81 mg daily   Preterm labor symptoms and general obstetric precautions including but not limited to vaginal bleeding, contractions, leaking of fluid and fetal movement were reviewed in detail with the patient. Please refer to After Visit Summary for other counseling recommendations.  Return in about 2 weeks (around 11/19/2022) for routine PNC.  No future appointments.  Alberteen Spindle, CNM

## 2022-11-20 ENCOUNTER — Ambulatory Visit: Payer: Medicaid Other | Admitting: Advanced Practice Midwife

## 2022-11-20 VITALS — BP 104/63 | HR 99 | Temp 98.2°F | Wt 210.4 lb

## 2022-11-20 DIAGNOSIS — O0990 Supervision of high risk pregnancy, unspecified, unspecified trimester: Secondary | ICD-10-CM

## 2022-11-20 DIAGNOSIS — O99210 Obesity complicating pregnancy, unspecified trimester: Secondary | ICD-10-CM

## 2022-11-20 NOTE — Progress Notes (Signed)
[facility]  PRENATAL VISIT NOTE  Subjective:  Katie Olson is a 27 y.o. [phone] at [redacted]w[redacted]d being seen today for ongoing prenatal care.  She is currently monitored for the following issues for this high-risk pregnancy and has Language barrier; Symptomatic cholelithiasis; History of cholecystectomy 10/02/21; History of gastritis (06/24/22); Obesity affecting pregnancy, antepartum; Supervision of high risk pregnancy, antepartum; Hx macrosomic infant 05/06/21 9#7.5; and Low TSH 07/02/22 (0.197) on their problem list.  Patient reports no complaints.  Contractions: Not present. Vag. Bleeding: None.  Movement: Present. Denies leaking of fluid/ROM.   The following portions of the patient's history were reviewed and updated as appropriate: allergies, current medications, past family history, past medical history, past social history, past surgical history and problem list. Problem list updated.  Objective:   Vitals:   11/20/22 0853  BP: 104/63  Pulse: 99  Temp: 98.2 F (36.8 C)  Weight: 210 lb 6.4 oz (95.4 kg)    Fetal Status: Fetal Heart Rate (bpm): 145 Fundal Height: 33 cm Movement: Present     General:  Alert, oriented and cooperative. Patient is in no acute distress.  Skin: Skin is warm and dry. No rash noted.   Cardiovascular: Normal heart rate noted  Respiratory: Normal respiratory effort, no problems with respiration noted  Abdomen: Soft, gravid, appropriate for gestational age.  Pain/Pressure: Absent     Pelvic: Cervical exam deferred        Extremities: Normal range of motion.  Edema: None  Mental Status: Normal mood and affect. Normal behavior. Normal judgment and thought content.   Assessment and Plan:  Pregnancy: G4P3003 at [redacted]w[redacted]d  1. Supervision of high risk pregnancy, antepartum Here with 2 young children Not working GERD resolved C/o sore throat with cough onset yesterday; encouraged to do covid test and call if +  2.  Obesity affecting pregnancy, antepartum, unspecified obesity type Taking ASA 81 mg daily -2 lb 9.6 oz (-1.179 kg) 1 lb wt gain in last 2 wks Walking 2-3x/wk x 30-45 min   Preterm labor symptoms and general obstetric precautions including but not limited to vaginal bleeding, contractions, leaking of fluid and fetal movement were reviewed in detail with the patient. Please refer to After Visit Summary for other counseling recommendations.  Return in about 2 weeks (around 12/04/2022) for routine PNC.  No future appointments.  Alberteen Spindle, CNM

## 2022-11-20 NOTE — Progress Notes (Signed)
Patient here for MH RV at 33 weeks. Kick counts reviewed and 3 cards given. [facility] contact card given as well.Burt Knack, RN

## 2022-12-04 ENCOUNTER — Encounter: Payer: Self-pay | Admitting: Physician Assistant

## 2022-12-04 ENCOUNTER — Ambulatory Visit: Payer: Medicaid Other | Admitting: Physician Assistant

## 2022-12-04 VITALS — BP 92/55 | HR 86 | Temp 97.7°F | Wt 214.2 lb

## 2022-12-04 DIAGNOSIS — O0993 Supervision of high risk pregnancy, unspecified, third trimester: Secondary | ICD-10-CM

## 2022-12-04 DIAGNOSIS — O99213 Obesity complicating pregnancy, third trimester: Secondary | ICD-10-CM

## 2022-12-04 DIAGNOSIS — O0990 Supervision of high risk pregnancy, unspecified, unspecified trimester: Secondary | ICD-10-CM

## 2022-12-04 DIAGNOSIS — O99210 Obesity complicating pregnancy, unspecified trimester: Secondary | ICD-10-CM

## 2022-12-04 DIAGNOSIS — Z3A35 35 weeks gestation of pregnancy: Secondary | ICD-10-CM

## 2022-12-04 NOTE — Progress Notes (Signed)
[facility]  PRENATAL VISIT NOTE  Subjective:  Katie Olson is a 27 y.o. [phone] at [redacted]w[redacted]d being seen today for ongoing prenatal care.  She is currently monitored for the following issues for this high-risk pregnancy and has Language barrier; Symptomatic cholelithiasis; History of cholecystectomy 10/02/21; History of gastritis (06/24/22); Obesity affecting pregnancy, antepartum; Supervision of high risk pregnancy, antepartum; Hx macrosomic infant 05/06/21 9#7.5; and Low TSH 07/02/22 (0.197) on their problem list.  Patient reports  one episode of pink vag bleeding after wiping about a week ago, none since. Was not associated with intercourse .  Contractions: Not present. Vag. Bleeding: None.  Movement: Present. Denies leaking of fluid/ROM.   The following portions of the patient's history were reviewed and updated as appropriate: allergies, current medications, past family history, past medical history, past social history, past surgical history and problem list. Problem list updated.  Objective:   Vitals:   12/04/22 0902  BP: (!) 92/55  Pulse: 86  Temp: 97.7 F (36.5 C)  Weight: 214 lb 3.2 oz (97.2 kg)    Fetal Status: Fetal Heart Rate (bpm): 147 Fundal Height: 36 cm Movement: Present     General:  Alert, oriented and cooperative. Patient is in no acute distress.  Skin: Skin is warm and dry. No rash noted.   Cardiovascular: Normal heart rate noted  Respiratory: Normal respiratory effort, no problems with respiration noted  Abdomen: Soft, gravid, appropriate for gestational age.  Pain/Pressure: Absent     Pelvic: Cervical exam deferred        Extremities: Normal range of motion.  Edema: None  Mental Status: Normal mood and affect. Normal behavior. Normal judgment and thought content.   Assessment and Plan:  Pregnancy: G4P3003 at [redacted]w[redacted]d  1. Supervision of high risk pregnancy, antepartum Reassurance re: single episode of light  spotting, to monitor for future episodes. Continue PNV.  2. [redacted] weeks gestation of pregnancy Anticipatory guidance re: routine 36-week testing at RV.  3. Obesity affecting pregnancy, antepartum, unspecified obesity type 1lb TWG, eating and drinking well, feels well. Appropriate fetal growth by fundal height. Continue low dose daily aspirin.   Preterm labor symptoms and general obstetric precautions including but not limited to vaginal bleeding, contractions, leaking of fluid and fetal movement were reviewed in detail with the patient. Please refer to After Visit Summary for other counseling recommendations.  Due to language barrier, an interpreter Rachel Bo T) was present during the history-taking and subsequent discussion (and the physical exam) with this patient.  Return in about 1 week (around 12/11/2022) for Routine prenatal care, 36 wk labs.  Future Appointments  Date Time Provider Department Center  12/12/2022 10:30 AM [facility] PROVIDER [facility] None    Landry Dyke, PA-C

## 2022-12-04 NOTE — Progress Notes (Signed)
Patient here for MH RV at 35 weeks. Burt Knack, RN

## 2022-12-10 NOTE — Progress Notes (Signed)
[facility]  PRENATAL VISIT NOTE  Subjective:  Katie Olson is a 27 y.o. [phone] at [redacted]w[redacted]d being seen today for ongoing prenatal care.  She is currently monitored for the following issues for this low-risk pregnancy and has Language barrier; Symptomatic cholelithiasis; History of cholecystectomy 10/02/21; History of gastritis (06/24/22); Obesity affecting pregnancy, antepartum; Supervision of high risk pregnancy, antepartum; Hx macrosomic infant 05/06/21 9#7.5; and Low TSH 07/02/22 (0.197) on their problem list.  Patient reports no complaints.  Contractions: Irregular. Vag. Bleeding: None.  Movement: Present. Denies leaking of fluid/ROM.   The following portions of the patient's history were reviewed and updated as appropriate: allergies, current medications, past family history, past medical history, past social history, past surgical history and problem list. Problem list updated.  Objective:  There were no vitals filed for this visit.  Fetal Status: Fetal Heart Rate (bpm): 134 Fundal Height: 36 cm Movement: Present     General:  Alert, oriented and cooperative. Patient is in no acute distress.  Skin: Skin is warm and dry. No rash noted.   Cardiovascular: Normal heart rate noted  Respiratory: Normal respiratory effort, no problems with respiration noted  Abdomen: Soft, gravid, appropriate for gestational age.  Pain/Pressure: Absent     Pelvic: Cervical exam deferred        Extremities: Normal range of motion.  Edema: Trace  Mental Status: Normal mood and affect. Normal behavior. Normal judgment and thought content.   Assessment and Plan:  Pregnancy: G4P3003 at [redacted]w[redacted]d  1. Supervision of high risk pregnancy, antepartum -taking PNV daily - initially wanted BTL but has since decided against  -undecided about BCM pp   2. Obesity affecting pregnancy, antepartum, unspecified obesity type 1 lb 3.2 oz (0.544 kg) -exercise- walking 2-3 days  a week for 35- 40 minutes  -taking ASA daily  3. [redacted] weeks gestation of pregnancy -standard 36 week labs today   Preterm labor symptoms and general obstetric precautions including but not limited to vaginal bleeding, contractions, leaking of fluid and fetal movement were reviewed in detail with the patient. Please refer to After Visit Summary for other counseling recommendations.  Return in about 1 week (around 12/19/2022) for Routine Prenatal Care.  No future appointments.  Due to language barrier, interpreter Katie Olson was present for this visit.  Lenice Llamas, [place]

## 2022-12-12 ENCOUNTER — Ambulatory Visit: Payer: Medicaid Other | Admitting: Family Medicine

## 2022-12-12 VITALS — BP 98/59 | HR 87 | Temp 97.8°F | Wt 215.0 lb

## 2022-12-12 DIAGNOSIS — O99213 Obesity complicating pregnancy, third trimester: Secondary | ICD-10-CM

## 2022-12-12 DIAGNOSIS — O0993 Supervision of high risk pregnancy, unspecified, third trimester: Secondary | ICD-10-CM

## 2022-12-12 DIAGNOSIS — Z3A36 36 weeks gestation of pregnancy: Secondary | ICD-10-CM

## 2022-12-12 DIAGNOSIS — O0990 Supervision of high risk pregnancy, unspecified, unspecified trimester: Secondary | ICD-10-CM

## 2022-12-12 DIAGNOSIS — O99210 Obesity complicating pregnancy, unspecified trimester: Secondary | ICD-10-CM

## 2022-12-12 NOTE — Progress Notes (Signed)
Spanish Interpretor [facility] utilized Roddie Mc). BTHIELE RN

## 2022-12-16 LAB — CHLAMYDIA/GC NAA, CONFIRMATION
Chlamydia trachomatis, NAA: NEGATIVE
Neisseria gonorrhoeae, NAA: NEGATIVE

## 2022-12-16 LAB — CULTURE, BETA STREP (GROUP B ONLY): Strep Gp B Culture: NEGATIVE

## 2022-12-19 ENCOUNTER — Ambulatory Visit: Payer: Medicaid Other | Admitting: Advanced Practice Midwife

## 2022-12-19 VITALS — BP 105/66 | HR 78 | Temp 97.4°F | Wt 218.0 lb

## 2022-12-19 DIAGNOSIS — O99213 Obesity complicating pregnancy, third trimester: Secondary | ICD-10-CM

## 2022-12-19 DIAGNOSIS — O99210 Obesity complicating pregnancy, unspecified trimester: Secondary | ICD-10-CM

## 2022-12-19 DIAGNOSIS — O0993 Supervision of high risk pregnancy, unspecified, third trimester: Secondary | ICD-10-CM

## 2022-12-19 DIAGNOSIS — O0990 Supervision of high risk pregnancy, unspecified, unspecified trimester: Secondary | ICD-10-CM

## 2022-12-19 NOTE — Progress Notes (Signed)
[facility]  PRENATAL VISIT NOTE  Subjective:  Katie Olson is a 27 y.o. [phone] at [redacted]w[redacted]d being seen today for ongoing prenatal care.  She is currently monitored for the following issues for this high-risk pregnancy and has Language barrier; Symptomatic cholelithiasis; History of cholecystectomy 10/02/21; History of gastritis (06/24/22); Obesity affecting pregnancy, antepartum; Supervision of high risk pregnancy, antepartum; Hx macrosomic infant 05/06/21 9#7.5; and Low TSH 07/02/22 (0.197) on their problem list.  Patient reports no complaints.  Contractions: Not present. Vag. Bleeding: None.  Movement: Present. Denies leaking of fluid/ROM.   The following portions of the patient's history were reviewed and updated as appropriate: allergies, current medications, past family history, past medical history, past social history, past surgical history and problem list. Problem list updated.  Objective:   Vitals:   12/19/22 0814  BP: 105/66  Pulse: 78  Temp: (!) 97.4 F (36.3 C)  Weight: 218 lb (98.9 kg)    Fetal Status: Fetal Heart Rate (bpm): 135 Fundal Height: 38 cm Movement: Present  Presentation: Vertex  General:  Alert, oriented and cooperative. Patient is in no acute distress.  Skin: Skin is warm and dry. No rash noted.   Cardiovascular: Normal heart rate noted  Respiratory: Normal respiratory effort, no problems with respiration noted  Abdomen: Soft, gravid, appropriate for gestational age.  Pain/Pressure: Absent     Pelvic: Cervical exam deferred        Extremities: Normal range of motion.  Edema: None  Mental Status: Normal mood and affect. Normal behavior. Normal judgment and thought content.   Assessment and Plan:  Pregnancy: G4P3003 at [redacted]w[redacted]d  1. Supervision of high risk pregnancy, antepartum Knows when to go to L&D Has car seat and crib GBS/GC/Chlamydia neg 12/12/22 Walking 2x/wk x 40 min Not working  2. Obesity  affecting pregnancy, antepartum, unspecified obesity type Taking ASA 81 mg daily 5 lb (2.268 kg) 3 lb wt gain in last week   Preterm labor symptoms and general obstetric precautions including but not limited to vaginal bleeding, contractions, leaking of fluid and fetal movement were reviewed in detail with the patient. Please refer to After Visit Summary for other counseling recommendations.  Return in about 1 week (around 12/26/2022) for routine PNC.  No future appointments.  Alberteen Spindle, CNM

## 2022-12-29 ENCOUNTER — Ambulatory Visit: Payer: Medicaid Other | Admitting: Advanced Practice Midwife

## 2022-12-29 VITALS — BP 103/56 | HR 77 | Temp 97.1°F | Wt 216.8 lb

## 2022-12-29 DIAGNOSIS — O0990 Supervision of high risk pregnancy, unspecified, unspecified trimester: Secondary | ICD-10-CM

## 2022-12-29 DIAGNOSIS — O0993 Supervision of high risk pregnancy, unspecified, third trimester: Secondary | ICD-10-CM

## 2022-12-29 DIAGNOSIS — O99210 Obesity complicating pregnancy, unspecified trimester: Secondary | ICD-10-CM

## 2022-12-29 DIAGNOSIS — O99213 Obesity complicating pregnancy, third trimester: Secondary | ICD-10-CM

## 2022-12-29 NOTE — Progress Notes (Signed)
Patient here for MH RV at 38 4/7. .Phenix Vandermeulen Brewer-Jensen, RN  °

## 2022-12-29 NOTE — Progress Notes (Signed)
[facility]  PRENATAL VISIT NOTE  Subjective:  Katie Olson is a 27 y.o. [phone] at [redacted]w[redacted]d being seen today for ongoing prenatal care.  She is currently monitored for the following issues for this high-risk pregnancy and has Language barrier; Symptomatic cholelithiasis; History of cholecystectomy 10/02/21; History of gastritis (06/24/22); Obesity affecting pregnancy, antepartum; Supervision of high risk pregnancy, antepartum; Hx macrosomic infant 05/06/21 9#7.5; and Low TSH 07/02/22 (0.197) on their problem list.  Patient reports no complaints.  Contractions: Not present. Vag. Bleeding: None.  Movement: Present. Denies leaking of fluid/ROM.   The following portions of the patient's history were reviewed and updated as appropriate: allergies, current medications, past family history, past medical history, past social history, past surgical history and problem list. Problem list updated.  Objective:   Vitals:   12/29/22 1114  BP: (!) 103/56  Pulse: 77  Temp: (!) 97.1 F (36.2 C)  Weight: 216 lb 12.8 oz (98.3 kg)    Fetal Status: Fetal Heart Rate (bpm): 130 Fundal Height: 40 cm Movement: Present  Presentation: Vertex  General:  Alert, oriented and cooperative. Patient is in no acute distress.  Skin: Skin is warm and dry. No rash noted.   Cardiovascular: Normal heart rate noted  Respiratory: Normal respiratory effort, no problems with respiration noted  Abdomen: Soft, gravid, appropriate for gestational age.  Pain/Pressure: Absent     Pelvic: Cervical exam performed Dilation: 1 Effacement (%): Thick Station: -3  Extremities: Normal range of motion.  Edema: Mild pitting, slight indentation  Mental Status: Normal mood and affect. Normal behavior. Normal judgment and thought content.   Assessment and Plan:  Pregnancy: G4P3003 at [redacted]w[redacted]d  1. Supervision of high risk pregnancy, antepartum Here with 2 small children Knows when to go to  L&D Has car seat and crib U/s ordered for S>D, AFI, presentation Asking for SVE due to low back ache and pelvic pressure Not working  2. Obesity affecting pregnancy, antepartum, unspecified obesity type 3 lb 12.8 oz (1.724 kg) Taking ASA 81 mg daily Walking 2x/wk x 40 min   Term labor symptoms and general obstetric precautions including but not limited to vaginal bleeding, contractions, leaking of fluid and fetal movement were reviewed in detail with the patient. Please refer to After Visit Summary for other counseling recommendations.  Return in about 1 week (around 01/05/2023) for routine PNC.  No future appointments.  Alberteen Spindle, CNM

## 2023-01-05 NOTE — Progress Notes (Unsigned)
[facility]  PRENATAL VISIT NOTE  Subjective:  Katie Olson is a 27 y.o. [phone] at [redacted]w[redacted]d being seen today for ongoing prenatal care.  She is currently monitored for the following issues for this {Blank single:19197::"high-risk","low-risk"} pregnancy and has Language barrier; Symptomatic cholelithiasis; History of cholecystectomy 10/02/21; History of gastritis (06/24/22); Obesity affecting pregnancy, antepartum; Supervision of high risk pregnancy, antepartum; Hx macrosomic infant 05/06/21 9#7.5; and Low TSH 07/02/22 (0.197) on their problem list.  Patient reports {[id number]}.   .  .   . ***Denies leaking of fluid/ROM.   The following portions of the patient's history were reviewed and updated as appropriate: allergies, current medications, past family history, past medical history, past social history, past surgical history and problem list. Problem list updated.  Objective:  There were no vitals filed for this visit.  Fetal Status:           General:  Alert, oriented and cooperative. Patient is in no acute distress.  Skin: Skin is warm and dry. No rash noted.   Cardiovascular: Normal heart rate noted  Respiratory: Normal respiratory effort, no problems with respiration noted  Abdomen: Soft, gravid, appropriate for gestational age.        Pelvic: {Blank single:19197::"Cervical exam performed","Cervical exam deferred"}        Extremities: Normal range of motion.     Mental Status: Normal mood and affect. Normal behavior. Normal judgment and thought content.   Assessment and Plan:  Pregnancy: G4P3003 at [redacted]w[redacted]d  1. Supervision of high risk pregnancy, antepartum -reviewed 6/10 US->  -taking PNV daily? -[redacted] weeks gestation- IOL papers and cervix check?   2. Obesity affecting pregnancy, antepartum, unspecified obesity type -weight -exercise    Term labor symptoms and general obstetric precautions including but not limited to vaginal  bleeding, contractions, leaking of fluid and fetal movement were reviewed in detail with the patient. Please refer to After Visit Summary for other counseling recommendations.  No follow-ups on file.  Future Appointments  Date Time Provider Department Center  01/06/2023 10:30 AM [facility] PROVIDER [facility] None    Lenice Llamas, FNP

## 2023-01-06 ENCOUNTER — Ambulatory Visit: Payer: Medicaid Other | Admitting: Family Medicine

## 2023-01-06 VITALS — BP 106/57 | HR 65 | Temp 97.2°F | Wt 219.4 lb

## 2023-01-06 DIAGNOSIS — O99210 Obesity complicating pregnancy, unspecified trimester: Secondary | ICD-10-CM

## 2023-01-06 DIAGNOSIS — O99213 Obesity complicating pregnancy, third trimester: Secondary | ICD-10-CM

## 2023-01-06 DIAGNOSIS — O0993 Supervision of high risk pregnancy, unspecified, third trimester: Secondary | ICD-10-CM

## 2023-01-06 DIAGNOSIS — O0990 Supervision of high risk pregnancy, unspecified, unspecified trimester: Secondary | ICD-10-CM

## 2023-01-06 NOTE — Progress Notes (Signed)
Patient here for MH RV at 39 5/7. Patient had [facility] U/S yesterday. Here with her 3 children. IOL referral needed today.Burt Knack, RN

## 2023-01-07 ENCOUNTER — Telehealth: Payer: Self-pay

## 2023-01-07 ENCOUNTER — Encounter: Payer: Self-pay | Admitting: Obstetrics and Gynecology

## 2023-01-07 ENCOUNTER — Inpatient Hospital Stay
Admission: EM | Admit: 2023-01-07 | Discharge: 2023-01-09 | DRG: 806 | Disposition: A | Discharge status: Home / Self Care | Payer: Medicaid Other

## 2023-01-07 DIAGNOSIS — K8020 Calculus of gallbladder without cholecystitis without obstruction: Secondary | ICD-10-CM | POA: Diagnosis present

## 2023-01-07 DIAGNOSIS — Z87891 Personal history of nicotine dependence: Secondary | ICD-10-CM | POA: Diagnosis not present

## 2023-01-07 DIAGNOSIS — Z3A39 39 weeks gestation of pregnancy: Secondary | ICD-10-CM

## 2023-01-07 DIAGNOSIS — O2662 Liver and biliary tract disorders in childbirth: Secondary | ICD-10-CM | POA: Diagnosis present

## 2023-01-07 DIAGNOSIS — O26893 Other specified pregnancy related conditions, third trimester: Secondary | ICD-10-CM | POA: Diagnosis present

## 2023-01-07 DIAGNOSIS — Z9049 Acquired absence of other specified parts of digestive tract: Secondary | ICD-10-CM

## 2023-01-07 DIAGNOSIS — Z7982 Long term (current) use of aspirin: Secondary | ICD-10-CM

## 2023-01-07 DIAGNOSIS — O0993 Supervision of high risk pregnancy, unspecified, third trimester: Secondary | ICD-10-CM | POA: Diagnosis not present

## 2023-01-07 DIAGNOSIS — O99214 Obesity complicating childbirth: Secondary | ICD-10-CM | POA: Diagnosis present

## 2023-01-07 MED ORDER — OXYTOCIN-SODIUM CHLORIDE 30-0.9 UT/500ML-% IV SOLN
2.5000 [IU]/h | INTRAVENOUS | Status: DC
  Filled 2023-01-07: qty 500

## 2023-01-07 MED ORDER — LIDOCAINE HCL (PF) 1 % IJ SOLN: 30.0000 mL | INTRAMUSCULAR | Status: DC | PRN

## 2023-01-07 MED ORDER — LACTATED RINGERS IV SOLN: 500.0000 mL | INTRAVENOUS | Status: DC | PRN

## 2023-01-07 MED ORDER — SOD CITRATE-CITRIC ACID 500-334 MG/5ML PO SOLN: 30.0000 mL | ORAL | Status: DC | PRN

## 2023-01-07 MED ORDER — OXYTOCIN BOLUS FROM INFUSION: 333.0000 mL | Freq: Once | INTRAVENOUS | Status: DC

## 2023-01-07 MED ORDER — ACETAMINOPHEN 325 MG PO TABS: 650.0000 mg | ORAL_TABLET | ORAL | Status: DC | PRN

## 2023-01-07 MED ORDER — LACTATED RINGERS IV SOLN: INTRAVENOUS | Status: DC

## 2023-01-07 MED ORDER — ONDANSETRON HCL 4 MG/2ML IJ SOLN: 4.0000 mg | Freq: Four times a day (QID) | INTRAMUSCULAR | Status: DC | PRN

## 2023-01-07 NOTE — Telephone Encounter (Signed)
[facility] IOL scheduled for 01/16/23 and client will receive a call from [facility] between 1100 and 1400 with arrival time.   Call to client and per recorded message, not accepting calls at this time. Call to emergency contact (relative, [place] speaker) who states client has not paid her phone bill. Requested she assist [place] in contacting client to call us regarding her [facility] IOL appt and number to call provided. Byrd Hesselbach states she will have client call us. Jossie Ng, RN

## 2023-01-07 NOTE — H&P (Signed)
OB History & Physical   History of Present Illness:   Chief Complaint: labor  HPI:  Katie Olson is a 27 y.o. [phone] female at [redacted]w[redacted]d, Patient's last menstrual period was 04/14/2022 (exact date).,  with Estimated Date of Delivery: 01/08/23.  She presents to L&D for advanced labor  Reports active fetal movement  Contractions: every 2 to 3 minutes LOF/SROM: intact Vaginal bleeding: none  Factors complicating pregnancy:  1.Symptomatic cholelithiasis 2.History of cholecystectomy 10/02/21 3.History of gastritis (06/24/22) 4.Obesity  5.Hx macrosomic infant 05/06/21 9#7.5;  6.Low TSH 07/02/22 (0.197)   Patient Active Problem List   Diagnosis Date Noted   Normal labor and delivery 01/07/2023   Hx macrosomic infant 05/06/21 9#7.5 08/13/2022   Low TSH 07/02/22 (0.197) 08/13/2022   History of cholecystectomy 10/02/21 07/02/2022   History of gastritis (06/24/22) 07/02/2022   Obesity affecting pregnancy, antepartum 07/02/2022   Supervision of high risk pregnancy, antepartum 07/02/2022   Symptomatic cholelithiasis    Language barrier 10/10/2014    Prenatal Transfer Tool  Maternal Diabetes: No Genetic Screening: Normal Maternal Ultrasounds/Referrals: Normal Fetal Ultrasounds or other Referrals:  None Maternal Substance Abuse:  No Significant Maternal Medications:  None Significant Maternal Lab Results: Group B Strep negative  Maternal Medical History:   Past Medical History:  Diagnosis Date   GERD (gastroesophageal reflux disease)    Headache    History of gastritis 07/02/2022   Patient denies medical problems     Past Surgical History:  Procedure Laterality Date   denies     gall baldder surgery      No Known Allergies  Prior to Admission medications   Medication Sig Start Date End Date Taking? Authorizing Provider  aspirin EC 81 MG tablet Take 81 mg by mouth daily. Swallow whole.    [provider]  Prenatal Vit-Fe Fumarate-FA (PRENATAL MULTIVITAMIN) TABS  tablet Take 1 tablet by mouth daily at 12 noon. 08/13/22   Alberteen Spindle, CNM     Prenatal care site:  [facility]  OB History  Gravida Para Term Preterm AB Living  4 3 3  0 0 3  SAB IAB Ectopic Multiple Live Births  0 0 0 0 3    # Outcome Date GA Lbr Len/2nd Weight Sex Delivery Anes PTL Lv  4 Current           3 Term 05/06/21 [redacted]w[redacted]d  4295 g M Vag-Spont   LIV  2 Term 08/12/17 [redacted]w[redacted]d  3000 g F Vag-Spont   LIV  1 Term 02/28/15 [redacted]w[redacted]d 22:57 / 01:56 3140 g F Vag-Spont EPI  LIV     Birth Comments: Hyperbilirubinemia, received phototherapy in hospital     Apgar1: 9  Apgar5: 9     Social History: She  reports that she quit smoking about 2 years ago. Her smoking use included cigarettes. She has never been exposed to tobacco smoke. She has never used smokeless tobacco. She reports that she does not drink alcohol and does not use drugs.  Family History: family history includes Diabetes in her maternal grandfather and maternal grandmother; Healthy in her father and mother; Kidney cancer in her daughter; Multiple births in her sister.   Review of Systems: A full review of systems was performed and negative except as noted in the HPI.     Physical Exam:  Vital Signs: LMP 04/14/2022 (Exact Date)   General: no acute distress.  HEENT: normocephalic, atraumatic Heart: regular rate & rhythm Lungs: normal respiratory effort Abdomen: soft, gravid, non-tender;  Pelvic:  External: Normal external female genitalia  Cervix:   /   /      Extremities: non-tender, symmetric,  edema bilaterally.  DTRs: +2  Neurologic: Alert & oriented x 3.    No results found for this or any previous visit (from the past 24 hour(s)).  Pertinent Results:  Prenatal Labs: Blood type/Rh O   Antibody screen Negative    Rubella 3.63 (12/06 1039)   Varicella Immune  RPR Non Reactive (03/27 1534)   HBsAg Negative (12/06 1039)  Hep C NR   HIV Non Reactive (12/06 1039)   GC neg  Chlamydia neg  Genetic screening cfDNA  negative   1 hour GTT 114  3 hour GTT   GBS Negative/-- (05/17 1051)    FHT:  FHR: 135 bpm, variability: moderate,  accelerations:  Present,  decelerations:  Present variables Category/reactivity:  Category II UC:   regular, every 2-3 minutes   Cephalic by Leopolds and SVE   No results found.  Assessment:  Katie Olson is a 27 y.o. [phone] female at [redacted]w[redacted]d with hx of macrosomia, low TSH, obesity, and symptomatic cholelithiasis.   Plan:  1. Admit to Labor & Delivery - consents reviewed and obtained - Dr. Jean Rosenthal notified of admission and plan of care   2. Fetal Well being  - Fetal Tracing: category 2 - Group B Streptococcus ppx not indicated: GBS negative - Presentation: cephalic confirmed by SVE   3. Routine OB: - Prenatal labs reviewed, as above - Rh positive - CBC, T&S, RPR on admit - Clear liquid diet , continuous IV fluids  4. Monitoring of labor  - Contractions monitored with external toco - Pelvis proven to 9lbs 7.5 oz a- Plan for expectant management  - Augmentation with AROM as appropriate  - Plan for  continuous fetal monitoring - Maternal pain control as desired; planning low intervention birth options  and unmedicated labor support options  - Anticipate vaginal delivery  5. Post Partum Planning: - Infant feeding: formula feeding - Contraception: no method - Tdap vaccine: Given prenatally - Flu vaccine: Given prenatally  Katie Olson, CNM 01/07/23 11:57 PM  Chari Manning, CNM Certified Nurse Midwife [facility]

## 2023-01-08 ENCOUNTER — Other Ambulatory Visit: Payer: Self-pay

## 2023-01-08 ENCOUNTER — Encounter: Payer: Self-pay | Admitting: Obstetrics and Gynecology

## 2023-01-08 DIAGNOSIS — O0993 Supervision of high risk pregnancy, unspecified, third trimester: Secondary | ICD-10-CM | POA: Diagnosis not present

## 2023-01-08 LAB — CBC
HCT: 36.4 % (ref 36.0–46.0)
HCT: 39.5 % (ref 36.0–46.0)
Hemoglobin: 12.1 g/dL (ref 12.0–15.0)
Hemoglobin: 12.8 g/dL (ref 12.0–15.0)
MCH: 25.7 pg — ABNORMAL LOW (ref 26.0–34.0)
MCH: 26.1 pg (ref 26.0–34.0)
MCHC: 32.4 g/dL (ref 30.0–36.0)
MCHC: 33.2 g/dL (ref 30.0–36.0)
MCV: 78.4 fL — ABNORMAL LOW (ref 80.0–100.0)
MCV: 79.3 fL — ABNORMAL LOW (ref 80.0–100.0)
Platelets: 272 10*3/uL (ref 150–400)
Platelets: 288 10*3/uL (ref 150–400)
RBC: 4.64 MIL/uL (ref 3.87–5.11)
RBC: 4.98 MIL/uL (ref 3.87–5.11)
RDW: 15.2 % (ref 11.5–15.5)
RDW: 15.4 % (ref 11.5–15.5)
WBC: 10.9 10*3/uL — ABNORMAL HIGH (ref 4.0–10.5)
WBC: 12.6 10*3/uL — ABNORMAL HIGH (ref 4.0–10.5)
nRBC: 0 % (ref 0.0–0.2)
nRBC: 0 % (ref 0.0–0.2)

## 2023-01-08 LAB — TYPE AND SCREEN
ABO/RH(D): O POS
Antibody Screen: NEGATIVE

## 2023-01-08 LAB — RPR: RPR Ser Ql: NONREACTIVE

## 2023-01-08 MED ORDER — OXYCODONE HCL 5 MG PO TABS
5.0000 mg | ORAL_TABLET | ORAL | Status: DC | PRN
  Administered 2023-01-08 (×2): 5 mg via ORAL
  Filled 2023-01-08 (×2): qty 1

## 2023-01-08 MED ORDER — SODIUM CHLORIDE 0.9 % IV SOLN: 250.0000 mL | INTRAVENOUS | Status: DC | PRN

## 2023-01-08 MED ORDER — BENZOCAINE-MENTHOL 20-0.5 % EX AERO
1.0000 | INHALATION_SPRAY | CUTANEOUS | Status: DC | PRN
  Administered 2023-01-08: 1 via TOPICAL
  Filled 2023-01-08: qty 56

## 2023-01-08 MED ORDER — BISACODYL 10 MG RE SUPP: 10.0000 mg | Freq: Every day | RECTAL | Status: DC | PRN

## 2023-01-08 MED ORDER — ACETAMINOPHEN 325 MG PO TABS
650.0000 mg | ORAL_TABLET | ORAL | Status: DC | PRN
  Administered 2023-01-08 – 2023-01-09 (×3): 650 mg via ORAL
  Filled 2023-01-08 (×3): qty 2

## 2023-01-08 MED ORDER — FLEET ENEMA 7-19 GM/118ML RE ENEM: 1.0000 | ENEMA | Freq: Every day | RECTAL | Status: DC | PRN

## 2023-01-08 MED ORDER — ZOLPIDEM TARTRATE 5 MG PO TABS: 5.0000 mg | ORAL_TABLET | Freq: Every evening | ORAL | Status: DC | PRN

## 2023-01-08 MED ORDER — ONDANSETRON HCL 4 MG PO TABS: 4.0000 mg | ORAL_TABLET | ORAL | Status: DC | PRN

## 2023-01-08 MED ORDER — SODIUM CHLORIDE 0.9% FLUSH: 3.0000 mL | INTRAVENOUS | Status: DC | PRN

## 2023-01-08 MED ORDER — DIBUCAINE (PERIANAL) 1 % EX OINT: 1.0000 | TOPICAL_OINTMENT | CUTANEOUS | Status: DC | PRN

## 2023-01-08 MED ORDER — PRENATAL MULTIVITAMIN CH
1.0000 | ORAL_TABLET | Freq: Every day | ORAL | Status: DC
  Administered 2023-01-08: 1 via ORAL
  Filled 2023-01-08: qty 1

## 2023-01-08 MED ORDER — WITCH HAZEL-GLYCERIN EX PADS: 1.0000 | MEDICATED_PAD | CUTANEOUS | Status: DC | PRN

## 2023-01-08 MED ORDER — SIMETHICONE 80 MG PO CHEW: 80.0000 mg | CHEWABLE_TABLET | ORAL | Status: DC | PRN

## 2023-01-08 MED ORDER — SODIUM CHLORIDE 0.9% FLUSH: 3.0000 mL | Freq: Two times a day (BID) | INTRAVENOUS | Status: DC

## 2023-01-08 MED ORDER — SENNOSIDES-DOCUSATE SODIUM 8.6-50 MG PO TABS
2.0000 | ORAL_TABLET | ORAL | Status: DC
  Administered 2023-01-09: 2 via ORAL
  Filled 2023-01-08: qty 2

## 2023-01-08 MED ORDER — COCONUT OIL OIL: 1.0000 | TOPICAL_OIL | Status: DC | PRN

## 2023-01-08 MED ORDER — ONDANSETRON HCL 4 MG/2ML IJ SOLN: 4.0000 mg | INTRAMUSCULAR | Status: DC | PRN

## 2023-01-08 MED ORDER — DIPHENHYDRAMINE HCL 25 MG PO CAPS: 25.0000 mg | ORAL_CAPSULE | Freq: Four times a day (QID) | ORAL | Status: DC | PRN

## 2023-01-08 MED ORDER — IBUPROFEN 600 MG PO TABS
600.0000 mg | ORAL_TABLET | Freq: Four times a day (QID) | ORAL | Status: DC
  Administered 2023-01-08 – 2023-01-09 (×4): 600 mg via ORAL
  Filled 2023-01-08 (×4): qty 1

## 2023-01-08 NOTE — Discharge Summary (Signed)
Obstetrical Discharge Summary  Patient Name: Katie Olson DOB: 03-Sep-1995 MRN: [id number]  Date of Admission: 01/07/2023 Date of Delivery: 01/08/23 Delivered by: Chari Manning, CNM  Date of Discharge: 01/09/2023  Primary OB: [facility] [id number] last menstrual period was 04/14/2022 (exact date). EDC Estimated Date of Delivery: 01/08/23 Gestational Age at Delivery: [redacted]w[redacted]d   Antepartum complications:   1.Symptomatic cholelithiasis 2.History of cholecystectomy 10/02/21 3.History of gastritis (06/24/22) 4.Obesity  5.Hx macrosomic infant 05/06/21 9#7.5;  6.Low TSH 07/02/22 (0.197)  Admitting Diagnosis: Normal labor and delivery [O80]  Secondary Diagnosis: Patient Active Problem List   Diagnosis Date Noted   Normal labor and delivery 01/07/2023   Hx macrosomic infant 05/06/21 9#7.5 08/13/2022   Low TSH 07/02/22 (0.197) 08/13/2022   History of cholecystectomy 10/02/21 07/02/2022   History of gastritis (06/24/22) 07/02/2022   Obesity affecting pregnancy, antepartum 07/02/2022   Supervision of high risk pregnancy, antepartum 07/02/2022   Symptomatic cholelithiasis    Language barrier 10/10/2014    Discharge Diagnosis: Term Pregnancy Delivered      Augmentation: AROM Complications: None Intrapartum complications/course: Katie Olson presented to L&D in advanced labor. She was 7/90/-1. She progressed  to C/C/+2 with a spontaneous urge to push.  She pushed  effectively over approximately 1 minutes for a spontaneous vaginal birth.  Delivery of a viable baby boy Delivery Type: spontaneous vaginal delivery Anesthesia: non-pharmacological methods Placenta: spontaneous To Pathology: No  Laceration: none Episiotomy: none Newborn Data: Live born female  Birth Weight:  8lb 1.1oz APGAR: 8, 9  Newborn Delivery   Birth date/time: 01/08/2023 00:25:00 Delivery type:       Postpartum Procedures: none Edinburgh:     01/08/2023    2:18 AM  Katie Olson Postnatal Depression Scale Screening Tool  I  have been able to laugh and see the funny side of things. 0  I have looked forward with enjoyment to things. 0  I have blamed myself unnecessarily when things went wrong. 1  I have been anxious or worried for no good reason. 1  I have felt scared or panicky for no good reason. 0  Things have been getting on top of me. 0  I have been so unhappy that I have had difficulty sleeping. 0  I have felt sad or miserable. 0  I have been so unhappy that I have been crying. 0  The thought of harming myself has occurred to me. 0  Edinburgh Postnatal Depression Scale Total 2     Post partum course:   Patient had an uncomplicated postpartum course.  By time of discharge on PPD#1, her pain was controlled on oral pain medications; she had appropriate lochia and was ambulating, voiding without difficulty and tolerating regular diet.  She was deemed stable for discharge to home.      Discharge Physical Exam:   BP 118/64 (BP Location: Left Arm)   Pulse 69   Temp 98.4 F (36.9 C) (Oral)   Resp 18   Ht 5\' 4"  (1.626 m)   Wt 99.5 kg   LMP 04/14/2022 (Exact Date)   SpO2 98%   Breastfeeding Unknown   BMI 37.65 kg/m   General: NAD CV: RRR Pulm: CTABL, nl effort ABD: s/nd/nt, fundus firm and below the umbilicus Lochia: moderate Perineum: minimal edema/intact DVT Evaluation: LE non-ttp, no evidence of DVT on exam.  Hemoglobin  Date Value Ref Range Status  01/08/2023 12.1 12.0 - 15.0 g/dL Final  05/24/2535 64.4 11.1 - 15.9 g/dL Final   HCT  Date Value Ref Range Status  01/08/2023 36.4 36.0 - 46.0 % Final   Hematocrit  Date Value Ref Range Status  07/02/2022 35.4 34.0 - 46.6 % Final    Risk assessment for postpartum VTE and prophylactic treatment: Very high risk factors: None High risk factors: None Moderate risk factors: BMI 30-40 kg/m2  Postpartum VTE prophylaxis with LMWH not indicated  Disposition: stable, discharge to home. Baby Feeding: formula feeding Baby Disposition: home with  mom  Rh Immune globulin indicated: No Rubella vaccine given: was not indicated Varivax vaccine given: was not indicated Flu vaccine given in AP setting: Yes  Tdap vaccine given in AP setting: Yes   Contraception: no method  Prenatal Labs:   Blood type/Rh O   Antibody screen Negative    Rubella 3.63 (12/06 1039)   Varicella Immune  RPR Non Reactive (03/27 1534)   HBsAg Negative (12/06 1039)  Hep C NR   HIV Non Reactive (12/06 1039)   GC neg  Chlamydia neg  Genetic screening cfDNA negative   1 hour GTT 114  3 hour GTT    GBS Negative/-- (05/17 1051)      Plan:  Katie Olson was discharged to home in good condition. Follow-up appointment with delivering provider in 6 weeks.   Discharge Medications: Allergies as of 01/09/2023   No Known Allergies      Medication List     STOP taking these medications    aspirin EC 81 MG tablet       TAKE these medications    acetaminophen 325 MG tablet Commonly known as: Tylenol Take 2 tablets (650 mg total) by mouth every 6 (six) hours as needed for fever or mild pain (for pain scale < 4).   benzocaine-Menthol 20-0.5 % Aero Commonly known as: DERMOPLAST Apply 1 Application topically as needed for irritation (perineal discomfort).   ibuprofen 600 MG tablet Commonly known as: ADVIL Take 1 tablet (600 mg total) by mouth every 6 (six) hours as needed for headache, fever or cramping.   prenatal multivitamin Tabs tablet Take 1 tablet by mouth daily at 12 noon.   witch hazel-glycerin pad Commonly known as: TUCKS Apply 1 Application topically as needed for hemorrhoids (for pain).         Follow-up Information     [facility] DEPT Follow up in 6 week(s).   Why: pp visit Contact information: [address] [place] [address] [phone]                Signed:  Janyce Olson, CNM 01/09/2023 9:30 AM

## 2023-01-08 NOTE — Progress Notes (Signed)
Postpartum Day  0  Due to language barrier, an interpreter was present during the history-taking and subsequent discussion (and for part of the physical exam) with this patient.  Subjective: no complaints, up ad lib, voiding, and tolerating PO  Doing well, no concerns. Ambulating without difficulty, pain managed with PO meds, tolerating regular diet, and voiding without difficulty.   No fever/chills, chest pain, shortness of breath, nausea/vomiting, or leg pain. No nipple or breast pain. No headache, visual changes, or RUQ/epigastric pain.  Objective: BP 116/70 (BP Location: Left Arm)   Pulse 61   Temp 98.1 F (36.7 C) (Oral)   Resp 20   Ht 5\' 4"  (1.626 m)   Wt 99.5 kg   LMP 04/14/2022 (Exact Date)   SpO2 100%   Breastfeeding Unknown   BMI 37.65 kg/m    Physical Exam:  General: alert, cooperative, and no distress Breasts: soft/nontender CV: RRR Pulm: nl effort, CTABL Abdomen: soft, non-tender, active bowel sounds Uterine Fundus: firm Perineum: minimal edema, intact Lochia: appropriate DVT Evaluation: No evidence of DVT seen on physical exam.  Recent Labs    01/08/23 0007 01/08/23 0601  HGB 12.8 12.1  HCT 39.5 36.4  WBC 10.9* 12.6*  PLT 272 288    Assessment/Plan: 27 y.o. [id number] postpartum day # 0  -Continue routine postpartum care -Lactation consult PRN for breastfeeding  -Discussed contraceptive options including implant, IUDs hormonal and non-hormonal, injection, pills/ring/patch, condoms, and NFP.    -Acute blood loss anemia - hemodynamically stable and asymptomatic; start PO ferrous sulfate BID with stool softeners  -Immunization status:   all immunizations up to date   Disposition: Continue inpatient postpartum care   LOS: 1 day   Gustavo Lah, CNM 01/08/2023, 9:30 AM   ----- Margaretmary Eddy  Certified Nurse Midwife [facility]

## 2023-01-08 NOTE — Lactation Note (Signed)
This note was copied from a baby's chart. Lactation Consultation Note  Patient Name: Katie Olson ZOXWR'U Date: 01/08/2023 Age:25 hours Reason for consult: Initial assessment;Term  Lactation to the room for initial visit. Mother is holding the baby. Mother states she is feeding both breast and formula and baby last BF about an hour ago. Encouraged feeding on demand and with cues. If baby is not cueing encouraged hand expression and skin to skin. Encouraged 8 or more attempts in the first 24 hours and 8 or more good feeds after 24 HOL. Reviewed appropriate diapers for days of life and How to know your baby is getting enough to eat. Reviewed "Understanding Postpartum and Newborn Care" booklet at bedside. [facility] # left on board, encouraged to call for any assistance. Mother has no further questions at this time.  Mother has a breast pump at home. States she BF her other children each for 3 months without difficulty.   Maternal Data Does the patient have breastfeeding experience prior to this delivery?: Yes How long did the patient breastfeed?: 3 months  Feeding Mother's Current Feeding Choice: Breast Milk and Formula  Interventions Interventions: Breast feeding basics reviewed;Education  Discharge Pump: Personal (has a DEBP at home)  Consult Status Consult Status: Follow-up  Hasina Kreager D Maleeka Sabatino 01/08/2023, 12:46 PM

## 2023-01-08 NOTE — Telephone Encounter (Signed)
Client delivered at 0046 01/08/23 at [facility]. Jossie Ng, RN

## 2023-01-09 MED ORDER — IBUPROFEN 600 MG PO TABS: 600.0000 mg | ORAL_TABLET | Freq: Four times a day (QID) | ORAL | 0 refills | Status: AC | PRN

## 2023-01-09 MED ORDER — BENZOCAINE-MENTHOL 20-0.5 % EX AERO: 1.0000 | INHALATION_SPRAY | CUTANEOUS | 0 refills | Status: AC | PRN

## 2023-01-09 MED ORDER — ACETAMINOPHEN 325 MG PO TABS: 650.0000 mg | ORAL_TABLET | Freq: Four times a day (QID) | ORAL | 0 refills | Status: AC | PRN

## 2023-01-09 MED ORDER — WITCH HAZEL-GLYCERIN EX PADS: 1.0000 | MEDICATED_PAD | CUTANEOUS | 0 refills | Status: AC | PRN

## 2023-01-09 NOTE — Lactation Note (Addendum)
This note was copied from a baby's chart. Lactation Consultation Note  Patient Name: Katie Olson ZOXWR'U Date: 01/09/2023 Age:25 hours Reason for consult: Follow-up assessment;Term   Maternal Data This is mom's 4th baby, SVD.   On follow-up today mom reports she is breastfeeding and formula feeding. Per mom baby latches and breastfeeds well and also bottle feeds well. Per mom"baby likes to eat." Mom reports she did not experience breast engorgement after her previous babies births and denies any nipple soreness. She has a manual pump at home. She has no breastfeeding questions or concerns. Has patient been taught Hand Expression?: Yes Does the patient have breastfeeding experience prior to this delivery?: Yes How long did the patient breastfeed?: 3 months  Feeding Mother's Current Feeding Choice: Breast Milk and Formula  Interventions Interventions: Breast feeding basics reviewed;Education  Discharge Discharge Education: Engorgement and breast care;Warning signs for feeding baby;Outpatient recommendation Pump: Manual  Consult Status Consult Status: Complete Date: 01/09/23 Follow-up type: In-patient All dialogue with patient with assistance of in person Spanish interpreter. Update provided to care nurse.  Fuller Song 01/09/2023, 11:18 AM

## 2023-01-09 NOTE — Discharge Instructions (Signed)
Discharge instructions:   Call office if you have any of the following:  headache, visual changes, fever >101.0 F, chills, breast concerns (engorgement, mastitis) excessive vaginal bleeding, incision drainage or problems, leg pain or redness, depression or any other concerns.   Activity: Do not lift > 10 lbs for 6 weeks.  No intercourse or tampons for 6 weeks.  No driving for 1-2 weeks or while taking pain medication. No strenuous activity or heavy lifting for 6 weeks.  No swimming pools, hot tubs or tub baths- showers only.    It is normal to bleed for up to 6 weeks. You should not soak through more than 1 pad in 1 hour.   Continue prenatal vitamin. Increase calories and fluids while breastfeeding.  Your milk will come in, in the next couple of days (right now it is colostrum).  You may have a slight fever when your milk comes in, but it should go away on its own.   If it does not, and rises above 101 F please call the doctor.  You will also feel achy and your breasts will be firm. They will also start to leak.  If you are breastfeeding, continue as you have been and you can pump/express milk for comfort.   For concerns about your baby, please call your pediatrician For breastfeeding concerns, the lactation consultant can be reached at [phone]  Postpartum blues (feelings of happy one minute and sad another minute) are normal for the first few weeks but if it gets worse let your doctor know.    Instrucciones de descarga:  Llame a la oficina si tiene alguno de los siguientes: dolor de cabeza, cambios visuales, fiebre >101.0 F, escalofros, problemas con los senos (ingurgitacin, mastitis) sangrado vaginal excesivo, problemas o drenaje de la incisin, dolor o enrojecimiento en las piernas, depresin o cualquier otra inquietud.  Actividad: No levante ms de 10 libras durante 6 semanas. Sin relaciones sexuales ni tampones durante 6 semanas. No conducir durante 1 a 2 semanas o  mientras est tomando analgsicos. No realizar actividades extenuantes ni levantar objetos pesados durante 6 semanas. No se permiten piscinas, jacuzzis ni baeras, solo duchas.  Es normal sangrar hasta por 6 semanas. No debes empapar ms de 1 toalla sanitaria en 1 hora.  Continuar con la vitamina prenatal. Aumente las caloras y los lquidos durante la lactancia.  Le bajar la leche en los prximos das (ahora mismo es calostro). Es posible que tenga un poco de fiebre cuando le baje la leche, pero debera desaparecer por s sola. Si no es as y supera los 101 F, llame al mdico. Tambin sentirs dolor y tus senos estarn firmes. Tambin empezarn a gotear. Si est amamantando, contine como hasta ahora y podr extraerse leche para su comodidad.  Si tiene inquietudes sobre su beb, llame a su pediatra. Si tiene inquietudes sobre la lactancia materna, puede comunicarse con la asesora en lactancia al [phone].  La tristeza posparto (sentimientos de felicidad un minuto y tristeza otro minuto) es normal durante las primeras semanas, pero si empeora, infrmeselo a su mdico. 

## 2023-01-16 ENCOUNTER — Ambulatory Visit: Payer: Medicaid Other

## 2023-02-23 ENCOUNTER — Encounter: Payer: Self-pay | Admitting: Advanced Practice Midwife

## 2023-02-23 ENCOUNTER — Ambulatory Visit: Payer: Medicaid Other | Admitting: Advanced Practice Midwife

## 2023-02-23 VITALS — BP 112/70 | Ht 64.0 in | Wt 209.4 lb

## 2023-02-23 DIAGNOSIS — Z3009 Encounter for other general counseling and advice on contraception: Secondary | ICD-10-CM

## 2023-02-23 LAB — HEMOGLOBIN, FINGERSTICK: Hemoglobin: 12.8 g/dL (ref 11.1–15.9)

## 2023-02-23 MED ORDER — NORGESTIM-ETH ESTRAD TRIPHASIC 0.18/0.215/0.25 MG-25 MCG PO TABS
1.0000 | ORAL_TABLET | Freq: Every day | ORAL | 13 refills | Status: AC
Start: 2023-02-23 — End: ?

## 2023-02-23 NOTE — Progress Notes (Signed)
[facility] Department  Postpartum Exam  Katie Olson is a 27 y.o. HF  [phone] female who presents for a postpartum visit. She is 6 weeks postpartum following a normal spontaneous vaginal delivery on 01/08/23 at 40.0 wks M 8#1.  I have fully reviewed the prenatal and intrapartum course. The delivery was at 40.0 gestational weeks.  Anesthesia: epidural. Postpartum course has been wnl. Baby is doing well. Baby is feeding by bottle - Carn[employer] Good Start. Bleeding no bleeding. Bowel function is normal. Bladder function is normal. Patient is sexually active. Contraception method is none. Postpartum depression screening: negative.  Bottlefeeding q hour (1 oz). Took baby to peds 2 wks ago and weighs 3 kilos. Denies lochia, no menses pp yet. Pp coitus x 2 02/20/23 without condom and 02/08/23 with condom. Last cig 3 years ago. Last ETOH 02/21/23 (4 beers). Last pap 10/04/20 neg. Denies vaping, cigars, MJ.    The pregnancy intention screening data noted above was reviewed. Potential methods of contraception were discussed. The patient elected to proceed with Oral Contraceptive; Female Condom.    Health Maintenance Due  Topic Date Due   HPV VACCINES (1 - 3-dose series) Never done   COVID-19 Vaccine (5 - 2023-24 season) 03/28/2022    The following portions of the patient's history were reviewed and updated as appropriate: allergies, current medications, past family history, past medical history, past social history, past surgical history, and problem list.  Review of Systems Pertinent items are noted in HPI.  Objective:  BP 112/70   Ht 5\' 4"  (1.626 m)   Wt 209 lb 6.4 oz (95 kg)   LMP 04/14/2022 (Exact Date) Comment: no period  since birth of baby  Breastfeeding No   BMI 35.94 kg/m    General:  alert   Breasts:  normal  Lungs: clear to auscultation bilaterally  Heart:  regular rate and rhythm, S1, S2 normal, no murmur, click, rub or gallop  Abdomen: soft, non-tender; bowel sounds  normal; no masses,  no organomegaly   Wound N/a  GU exam:  normal       Assessment:    1. Family planning Pt desires ocp's for birth control--e-rx'd Tri Lo Sprintec with 13 RF to begin today Pt counseled abstinance next 7 days  Pt counseled if no menses after completion of first pack to do PT and call us if +  2. Postpartum exam Last pap 10/04/20 neg   6 week postpartum exam.   Plan:   Essential components of care per ACOG recommendations:  1.  Mood and well being: Patient with negative depression screening today. Reviewed local resources for support.  - Patient tobacco use? No.   - hx of drug use? No.    2. Infant care and feeding:  -Patient currently breastmilk feeding? No.  -Social determinants of health (SDOH) reviewed in EPIC. No concernsThe following needs were identified  3. Sexuality, contraception and birth spacing - Patient does not want a pregnancy in the next year.  Desired family size is 4 children.  - Reviewed reproductive life planning. Reviewed options based on patient desire and reproductive life plan. Patient is interested in Oral Contraceptive. This was provided to the patient today.  if not why not clearly documented  Risks, benefits, and typical effectiveness rates were reviewed.  Questions were answered.  Written information was also given to the patient to review.    The patient will follow up in  1 years for surveillance.  The patient was told to  call with any further questions, or with any concerns about this method of contraception.  Emphasized use of condoms 100% of the time for STI prevention.  Patient was offered ECP based on > 120 hours .  Patient is within 3 days of unprotected sex. Patient was offered ECP. Reviewed options and patient desired No method of ECP, declined all    - Discussed birth spacing of 18 months  4. Sleep and fatigue -Encouraged family/partner/community support of 4 hrs of uninterrupted sleep to help with mood and  fatigue  5. Physical Recovery  - Discussed patients delivery and complications. She describes her labor as good. - Patient had a Vaginal, no problems at delivery. Patient had a  0  laceration. Perineal healing reviewed. Patient expressed understanding - Patient has urinary incontinence? No. - Patient is not safe to resume physical and sexual activity  6.  Health Maintenance - HM due items addressed Yes - Last pap smear No results found for: "DIAGPAP" Pap smear not done at today's visit.  -Breast Cancer screening indicated? No.   7. Chronic Disease/Pregnancy Condition follow up: None  - PCP follow up  Alberteen Spindle, CNM [facility] Department

## 2023-02-23 NOTE — Progress Notes (Signed)
Patient here for PP exam. SVD on 01/08/23 at [facility]. Desires OC and condoms for [facility].Marland KitchenBurt Knack, RN

## 2023-08-18 ENCOUNTER — Encounter: Payer: Self-pay | Admitting: Intensive Care

## 2023-08-18 ENCOUNTER — Emergency Department
Admission: EM | Admit: 2023-08-18 | Discharge: 2023-08-18 | Disposition: A | Discharge status: Home / Self Care | Payer: Medicaid Other | Attending: Emergency Medicine | Admitting: Emergency Medicine

## 2023-08-18 ENCOUNTER — Emergency Department: Payer: Medicaid Other

## 2023-08-18 ENCOUNTER — Other Ambulatory Visit: Payer: Self-pay

## 2023-08-18 DIAGNOSIS — Y990 Civilian activity done for income or pay: Secondary | ICD-10-CM | POA: Insufficient documentation

## 2023-08-18 DIAGNOSIS — S60021A Contusion of right index finger without damage to nail, initial encounter: Secondary | ICD-10-CM | POA: Diagnosis not present

## 2023-08-18 DIAGNOSIS — W292XXA Contact with other powered household machinery, initial encounter: Secondary | ICD-10-CM | POA: Insufficient documentation

## 2023-08-18 DIAGNOSIS — S6991XA Unspecified injury of right wrist, hand and finger(s), initial encounter: Secondary | ICD-10-CM

## 2023-08-18 MED ORDER — CEPHALEXIN 500 MG PO CAPS: 500.0000 mg | ORAL_CAPSULE | Freq: Two times a day (BID) | ORAL | 0 refills | Status: AC

## 2023-08-18 NOTE — ED Notes (Signed)
See triage notes. Patient c/o right hand/index finger injury from a sewing machine while at work.

## 2023-08-18 NOTE — Discharge Instructions (Addendum)
You were evaluated in the ED for right index finger injury.  Your x-rays are normal.  Your finger was cleansed thoroughly in the ED.  Dermabond glue was placed over overlying skin to keep in place.  There is some bruising in which application of ice over the area will help reduce.  Follow-up with primary care as needed. A list has been provided for you.    If you have been prescribed antibiotics, take them exactly as directed. Do not stop taking them because your symptoms have improved. Take over-the-counter Tylenol or Ibuprofen with food or snacks as needed for pain.Take care and be patient!

## 2023-08-18 NOTE — ED Triage Notes (Signed)
Patient presents with right hand, index finger injury from sewing machine while at work

## 2023-08-18 NOTE — ED Provider Notes (Signed)
[facility] Emergency Department Provider Note     Event Date/Time   First MD Initiated Contact with Patient 08/18/23 1834     (approximate)   History   Finger Injury   HPI  Katie Olson is a 28 y.o. female presents to the ED for evaluation of her right index finger.  Patient reports she was at work when she accidentally injured her finger with a sewing machine. Denies loss of movement, numbness and tingling. Tetanus is UTD.       Physical Exam   Triage Vital Signs: ED Triage Vitals  Encounter Vitals Group     BP 08/18/23 1820 114/64     Systolic BP Percentile --      Diastolic BP Percentile --      Pulse Rate 08/18/23 1820 100     Resp 08/18/23 1820 18     Temp 08/18/23 1820 98.2 F (36.8 C)     Temp Source 08/18/23 1820 Oral     SpO2 08/18/23 1820 100 %     Weight 08/18/23 1821 200 lb (90.7 kg)     Height 08/18/23 1821 5\' 4"  (1.626 m)     Head Circumference --      Peak Flow --      Pain Score 08/18/23 1825 7     Pain Loc --      Pain Education --      Exclude from Growth Chart --     Most recent vital signs: Vitals:   08/18/23 1820  BP: 114/64  Pulse: 100  Resp: 18  Temp: 98.2 F (36.8 C)  SpO2: 100%    General Awake, no distress.  HEENT NCAT. PERRL. EOMI. No rhinorrhea. Mucous membranes are moist.  CV:  Good peripheral perfusion.  RESP:  Normal effort.  ABD:  No distention.  Other:   Right index finger reveals ecchymosis over dorsal DIP joint with abrasion. FROM.  Neurovascular status intact all throughout.  Brisk capillary refills.   ED Results / Procedures / Treatments   Labs (all labs ordered are listed, but only abnormal results are displayed) Labs Reviewed - No data to display  RADIOLOGY  I personally viewed and evaluated these images as part of my medical decision making, as well as reviewing the written report by the radiologist.  ED Provider Interpretation: Index finger appears normal on x-ray.  No  acute bony abnormality.  DG Hand Complete Right Result Date: 08/18/2023 CLINICAL DATA:  Index finger injury from sewing machine at work. Pain EXAM: RIGHT HAND - COMPLETE 3 VIEW COMPARISON:  None Available. FINDINGS: There is no evidence of fracture or dislocation. There is no evidence of arthropathy or other focal bone abnormality. Soft tissues are unremarkable. IMPRESSION: No acute osseous abnormality. Electronically Signed   By: Karen Kays M.D.   On: 08/18/2023 18:58   PROCEDURES:  Critical Care performed: No  Procedures  MEDICATIONS ORDERED IN ED: Medications - No data to display  IMPRESSION / MDM / ASSESSMENT AND PLAN / ED COURSE  I reviewed the triage vital signs and the nursing notes.                               28 y.o. female presents to the emergency department for evaluation and treatment of finger injury. See HPI for further details.   Differential diagnosis includes, but is not limited to laceration, dislocation, foreign body retention, hematoma  Patient's  presentation is most consistent with acute complicated illness / injury requiring diagnostic workup.  Patient is alert and oriented.  She is hemodynamically stable.  X-ray obtained in triage.  Reassuring.  No acute bony abnormality or obtain foreign body.  No indication for suture given superficial nature of injury.  Area is cleansed thoroughly with saline, iodine and chlorhexidine.  Applied dermabond and applied bandage.  No tetanus injection indicated during this encounter.  Tetanus is up-to-date per patient.  Patient is in satisfactory condition for discharge home.  Will prescribe short course of antibiotics.  Encouraged to follow-up with primary care as needed for further management.  ED return precautions discussed.  FINAL CLINICAL IMPRESSION(S) / ED DIAGNOSES   Final diagnoses:  Injury of finger of right hand, initial encounter   Rx / DC Orders   ED Discharge Orders          Ordered    cephALEXin (KEFLEX)  500 MG capsule  2 times daily        08/18/23 1953            Note:  This document was prepared using Dragon voice recognition software and may include unintentional dictation errors.    Romeo Apple, Keller Mikels A, PA-C 08/18/23 2138    Merwyn Katos, MD 08/18/23 2158

## 2023-08-18 NOTE — ED Provider Triage Note (Signed)
Emergency Medicine Provider Triage Evaluation Note  Alexandria Kortney Cavell , a 28 y.o. female  was evaluated in triage.  Pt complains of right index finger injury, had an injury with sewing machine at work.  Review of Systems  Positive: Right index finger pain Negative: Numbness/tingling, bleeding  Physical Exam  BP 114/64 (BP Location: Left Arm)   Pulse 100   Temp 98.2 F (36.8 C) (Oral)   Resp 18   Ht 5\' 4"  (1.626 m)   Wt 90.7 kg   SpO2 100%   BMI 34.33 kg/m  Gen:   Awake, no distress   Resp:  Normal effort  MSK:   Moves extremities without difficulty  Other:    Medical Decision Making  Medically screening exam initiated at 6:24 PM.  Appropriate orders placed.  Eilidh Atenea Bohmer was informed that the remainder of the evaluation will be completed by another provider, this initial triage assessment does not replace that evaluation, and the importance of remaining in the ED until their evaluation is complete.     Jackelyn Hoehn, PA-C 08/18/23 1829

## 2024-03-29 ENCOUNTER — Emergency Department

## 2024-03-29 ENCOUNTER — Emergency Department
Admission: EM | Admit: 2024-03-29 | Discharge: 2024-03-29 | Disposition: A | Discharge status: Home / Self Care | Attending: Emergency Medicine | Admitting: Emergency Medicine

## 2024-03-29 ENCOUNTER — Encounter: Payer: Self-pay | Admitting: Emergency Medicine

## 2024-03-29 ENCOUNTER — Other Ambulatory Visit: Payer: Self-pay

## 2024-03-29 DIAGNOSIS — O4691 Antepartum hemorrhage, unspecified, first trimester: Secondary | ICD-10-CM | POA: Diagnosis present

## 2024-03-29 DIAGNOSIS — Z3A08 8 weeks gestation of pregnancy: Secondary | ICD-10-CM | POA: Diagnosis not present

## 2024-03-29 DIAGNOSIS — O021 Missed abortion: Secondary | ICD-10-CM

## 2024-03-29 DIAGNOSIS — N939 Abnormal uterine and vaginal bleeding, unspecified: Secondary | ICD-10-CM | POA: Insufficient documentation

## 2024-03-29 LAB — HCG, QUANTITATIVE, PREGNANCY: hCG, Beta Chain, Quant, S: 11 m[IU]/mL — ABNORMAL HIGH (ref <5)

## 2024-03-29 LAB — CBC WITH DIFFERENTIAL/PLATELET
Abs Immature Granulocytes: 0.03 K/uL (ref 0.00–0.07)
Basophils Absolute: 0 K/uL (ref 0.0–0.1)
Basophils Relative: 0 %
Eosinophils Absolute: 0.1 K/uL (ref 0.0–0.5)
Eosinophils Relative: 1 %
HCT: 38.5 % (ref 36.0–46.0)
Hemoglobin: 12.4 g/dL (ref 12.0–15.0)
Immature Granulocytes: 0 %
Lymphocytes Relative: 24 %
Lymphs Abs: 2 K/uL (ref 0.7–4.0)
MCH: 26.6 pg (ref 26.0–34.0)
MCHC: 32.2 g/dL (ref 30.0–36.0)
MCV: 82.4 fL (ref 80.0–100.0)
Monocytes Absolute: 0.4 K/uL (ref 0.1–1.0)
Monocytes Relative: 5 %
Neutro Abs: 5.8 K/uL (ref 1.7–7.7)
Neutrophils Relative %: 70 %
Platelets: 385 K/uL (ref 150–400)
RBC: 4.67 MIL/uL (ref 3.87–5.11)
RDW: 14.6 % (ref 11.5–15.5)
WBC: 8.4 K/uL (ref 4.0–10.5)
nRBC: 0 % (ref 0.0–0.2)

## 2024-03-29 LAB — URINALYSIS, ROUTINE W REFLEX MICROSCOPIC
Bacteria, UA: NONE SEEN
Bilirubin Urine: NEGATIVE
Glucose, UA: NEGATIVE mg/dL
Ketones, ur: NEGATIVE mg/dL
Leukocytes,Ua: NEGATIVE
Nitrite: NEGATIVE
Protein, ur: NEGATIVE mg/dL
RBC / HPF: >50 RBC/hpf (ref 0–5)
Specific Gravity, Urine: 1.015 (ref 1.005–1.030)
pH: 6 (ref 5.0–8.0)

## 2024-03-29 LAB — ABO/RH: ABO/RH(D): O POS

## 2024-03-29 LAB — POC URINE PREG, ED: Preg Test, Ur: NEGATIVE

## 2024-03-29 NOTE — ED Provider Notes (Signed)
 [facility] Provider Note    Event Date/Time   First MD Initiated Contact with Patient 03/29/24 1522     (approximate)   History   Vaginal Bleeding   HPI  Katie Olson is a 28 y.o. female [phone] with PMH of GERD who presents for evaluation of vaginal bleeding. Patient states she is about [redacted] weeks pregnant, LMP 01/13/24.  Patient reports she went 2 months without a menstrual cycle and took a pregnancy test on "Sunday that was positive.  She began bleeding today.  She does not have any associated pain.      Physical Exam   Triage Vital Signs: ED Triage Vitals [03/29/24 1316]  Encounter Vitals Group     BP 117/72     Girls Systolic BP Percentile      Girls Diastolic BP Percentile      Boys Systolic BP Percentile      Boys Diastolic BP Percentile      Pulse Rate 61     Resp 18     Temp 98.7 F (37.1 C)     Temp Source Oral     SpO2 97 %     Weight 198 lb 6.6 oz (90 kg)     Height 5' 4 (1.626 m)     Head Circumference      Peak Flow      Pain Score 0     Pain Loc      Pain Education      Exclude from Growth Chart     Most recent vital signs: Vitals:   03/29/24 1316 03/29/24 1755  BP: 117/72 123/87  Pulse: 61 60  Resp: 18 16  Temp: 98.7 F (37.1 C) 98 F (36.7 C)  SpO2: 97% 96%   General: Awake, no distress.  CV:  Good peripheral perfusion.  RRR. Resp:  Normal effort.  CTAB Abd:  No distention.  Other:     ED Results / Procedures / Treatments   Labs (all labs ordered are listed, but only abnormal results are displayed) Labs Reviewed  URINALYSIS, ROUTINE W REFLEX MICROSCOPIC - Abnormal; Notable for the following components:      Result Value   Color, Urine YELLOW (*)    APPearance CLEAR (*)    Hgb urine dipstick LARGE (*)    All other components within normal limits  HCG, QUANTITATIVE, PREGNANCY - Abnormal; Notable for the following components:   hCG, Beta Chain, Quant, S 11 (*)    All other components within normal  limits  CBC WITH DIFFERENTIAL/PLATELET  POC URINE PREG, ED  ABO/RH     RADIOLOGY  OB ultrasound obtained, interpreted the images as well as reviewed the radiologist report.  Does not show an intrauterine pregnancy.  PROCEDURES:  Critical Care performed: No  Procedures   MEDICATIONS ORDERED IN ED: Medications - No data to display   IMPRESSION / MDM / ASSESSMENT AND PLAN / ED COURSE  I reviewed the triage vital signs and the nursing notes.                             28"  year old female presents for evaluation of vaginal bleeding with a positive pregnancy test.  Vital signs are stable patient NAD on exam.  Differential diagnosis includes, but is not limited to, implantation bleeding, miscarriage, ectopic pregnancy, menstrual cycle.  Patient's presentation is most consistent with acute complicated illness / injury requiring diagnostic workup.  CBC  is unremarkable, no anemia. UA shows RBCs otherwise unremarkable, no signs of infection. Urine pregnancy test is negative.  Beta-hCG is 11, suspect very early pregnancy versus miscarriage.  Patient is O+ blood type so no indication for RhoGAM at this time.  Will obtain ultrasound to rule out ectopic pregnancy.  If negative will recommend that patient follow-up with OB for repeat beta hCG in 48 hours.  Ultrasound does not show an intrauterine pregnancy.  Does not show evidence of a ruptured ectopic either.  Feel that patient is stable for outpatient management.  Patient was advised on appropriate follow-up timing.  We reviewed return precautions.  She voiced understanding, all questions were answered and she was stable at discharge.    FINAL CLINICAL IMPRESSION(S) / ED DIAGNOSES   Final diagnoses:  Vaginal bleeding in pregnancy     Rx / DC Orders   ED Discharge Orders     None        Note:  This document was prepared using Dragon voice recognition software and may include unintentional dictation errors.   Cleaster Tinnie LABOR, PA-C 03/29/24 1940    Waymond Lorelle Cummins, MD 03/29/24 2215

## 2024-03-29 NOTE — ED Notes (Signed)
 Transported to [place].

## 2024-03-29 NOTE — ED Triage Notes (Signed)
 Patient to ED via POV for vaginal bleeding. Approx 8 weeks- has not seen OB yesterday. Bleeding started this AM.

## 2024-03-29 NOTE — Discharge Instructions (Addendum)
 There were no signs of infection in your urine. Your blood work was normal today. We checked something called a beta hCG which is a pregnancy hormone.  Based on the level that you had today you may have a very early pregnancy or you may be miscarrying.  The ultrasound did not show a pregnancy within your uterus which is consistent with a very early pregnancy or a miscarriage.  It is important that you follow-up with the OB/GYN whose information is attached.  You will need to have a repeat beta-hCG drawn in 48 hours.  Please call the office to schedule.  If you are unable to get into see them, you can return to the emergency department to have this blood test done.  Return to the emergency department if you are bleeding through a maxi pad within an hour for more than 2 hours back-to-back.  You may continue to bleed over the next week.  You may have passed large clots.  This is a normal part of the miscarriage process.
